# Patient Record
Sex: Male | Born: 1935 | Race: White | Hispanic: No | Smoking: Former smoker
Health system: Southern US, Community
[De-identification: ages and names within clinical notes are randomized; demographics above are authoritative.]

## PROBLEM LIST (undated history)

## (undated) DIAGNOSIS — I2510 Atherosclerotic heart disease of native coronary artery without angina pectoris: Secondary | ICD-10-CM

## (undated) DIAGNOSIS — J449 Chronic obstructive pulmonary disease, unspecified: Secondary | ICD-10-CM

## (undated) DIAGNOSIS — C4491 Basal cell carcinoma of skin, unspecified: Secondary | ICD-10-CM

## (undated) DIAGNOSIS — I4891 Unspecified atrial fibrillation: Secondary | ICD-10-CM

## (undated) DIAGNOSIS — I10 Essential (primary) hypertension: Secondary | ICD-10-CM

## (undated) DIAGNOSIS — E785 Hyperlipidemia, unspecified: Secondary | ICD-10-CM

## (undated) HISTORY — DX: Hyperlipidemia, unspecified: E78.5

## (undated) HISTORY — PX: HERNIA REPAIR: SHX51

---
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
## 1997-02-26 HISTORY — PX: CORONARY ARTERY BYPASS GRAFT: SHX141

## 2008-02-05 ENCOUNTER — Ambulatory Visit: Payer: Self-pay | Admitting: Ophthalmology

## 2008-02-13 ENCOUNTER — Inpatient Hospital Stay: Payer: Self-pay | Admitting: Internal Medicine

## 2008-03-23 ENCOUNTER — Ambulatory Visit: Payer: Self-pay | Admitting: Ophthalmology

## 2012-10-02 ENCOUNTER — Emergency Department: Payer: Self-pay | Admitting: Emergency Medicine

## 2012-10-10 ENCOUNTER — Ambulatory Visit: Payer: Self-pay | Admitting: Orthopedic Surgery

## 2012-10-15 ENCOUNTER — Ambulatory Visit: Payer: Self-pay | Admitting: Orthopedic Surgery

## 2012-10-15 LAB — BASIC METABOLIC PANEL WITH GFR
Anion Gap: 0 — ABNORMAL LOW
BUN: 30 mg/dL — ABNORMAL HIGH
Calcium, Total: 9.3 mg/dL
Chloride: 104 mmol/L
Co2: 34 mmol/L — ABNORMAL HIGH
Creatinine: 1.29 mg/dL
EGFR (African American): 57 — ABNORMAL LOW
EGFR (Non-African Amer.): 49 — ABNORMAL LOW
Glucose: 101 mg/dL — ABNORMAL HIGH
Osmolality: 282
Potassium: 4.4 mmol/L
Sodium: 138 mmol/L

## 2012-10-15 LAB — HEMOGLOBIN: HGB: 14.2 g/dL

## 2012-10-21 ENCOUNTER — Ambulatory Visit: Payer: Self-pay | Admitting: Orthopedic Surgery

## 2013-06-08 DIAGNOSIS — R0602 Shortness of breath: Secondary | ICD-10-CM | POA: Diagnosis not present

## 2013-06-08 DIAGNOSIS — I059 Rheumatic mitral valve disease, unspecified: Secondary | ICD-10-CM | POA: Diagnosis not present

## 2013-06-08 DIAGNOSIS — E782 Mixed hyperlipidemia: Secondary | ICD-10-CM | POA: Diagnosis not present

## 2013-06-08 DIAGNOSIS — I2510 Atherosclerotic heart disease of native coronary artery without angina pectoris: Secondary | ICD-10-CM | POA: Diagnosis not present

## 2013-06-25 DIAGNOSIS — R0602 Shortness of breath: Secondary | ICD-10-CM | POA: Diagnosis not present

## 2013-06-25 DIAGNOSIS — I119 Hypertensive heart disease without heart failure: Secondary | ICD-10-CM | POA: Diagnosis not present

## 2013-06-25 DIAGNOSIS — I059 Rheumatic mitral valve disease, unspecified: Secondary | ICD-10-CM | POA: Diagnosis not present

## 2013-06-25 DIAGNOSIS — E782 Mixed hyperlipidemia: Secondary | ICD-10-CM | POA: Diagnosis not present

## 2013-08-09 ENCOUNTER — Emergency Department: Payer: Self-pay | Admitting: Emergency Medicine

## 2013-08-09 DIAGNOSIS — I714 Abdominal aortic aneurysm, without rupture, unspecified: Secondary | ICD-10-CM | POA: Diagnosis not present

## 2013-08-09 DIAGNOSIS — N201 Calculus of ureter: Secondary | ICD-10-CM | POA: Diagnosis not present

## 2013-08-09 DIAGNOSIS — I713 Abdominal aortic aneurysm, ruptured, unspecified: Secondary | ICD-10-CM | POA: Diagnosis not present

## 2013-08-09 DIAGNOSIS — N210 Calculus in bladder: Secondary | ICD-10-CM | POA: Diagnosis not present

## 2013-08-09 DIAGNOSIS — J449 Chronic obstructive pulmonary disease, unspecified: Secondary | ICD-10-CM | POA: Diagnosis not present

## 2013-08-09 DIAGNOSIS — E785 Hyperlipidemia, unspecified: Secondary | ICD-10-CM | POA: Diagnosis not present

## 2013-08-09 DIAGNOSIS — K8020 Calculus of gallbladder without cholecystitis without obstruction: Secondary | ICD-10-CM | POA: Diagnosis not present

## 2013-08-09 DIAGNOSIS — N200 Calculus of kidney: Secondary | ICD-10-CM | POA: Diagnosis not present

## 2013-08-09 LAB — COMPREHENSIVE METABOLIC PANEL
ALT: 18 U/L (ref 12–78)
ANION GAP: 2 — AB (ref 7–16)
AST: 29 U/L (ref 15–37)
Albumin: 4.1 g/dL (ref 3.4–5.0)
Alkaline Phosphatase: 76 U/L
BUN: 21 mg/dL — ABNORMAL HIGH (ref 7–18)
Bilirubin,Total: 1.2 mg/dL — ABNORMAL HIGH (ref 0.2–1.0)
CALCIUM: 9.1 mg/dL (ref 8.5–10.1)
Chloride: 103 mmol/L (ref 98–107)
Co2: 32 mmol/L (ref 21–32)
Creatinine: 1.33 mg/dL — ABNORMAL HIGH (ref 0.60–1.30)
EGFR (African American): 54 — ABNORMAL LOW
EGFR (Non-African Amer.): 47 — ABNORMAL LOW
Glucose: 113 mg/dL — ABNORMAL HIGH (ref 65–99)
Osmolality: 278 (ref 275–301)
Potassium: 4.5 mmol/L (ref 3.5–5.1)
SODIUM: 137 mmol/L (ref 136–145)
Total Protein: 7.5 g/dL (ref 6.4–8.2)

## 2013-08-09 LAB — URINALYSIS, COMPLETE
BACTERIA: NONE SEEN
Bilirubin,UR: NEGATIVE
GLUCOSE, UR: NEGATIVE mg/dL (ref 0–75)
Ketone: NEGATIVE
Nitrite: NEGATIVE
PROTEIN: NEGATIVE
Ph: 5 (ref 4.5–8.0)
RBC,UR: 252 /HPF (ref 0–5)
SPECIFIC GRAVITY: 1.023 (ref 1.003–1.030)

## 2013-08-09 LAB — CBC
HCT: 45.3 % (ref 40.0–52.0)
HGB: 15.1 g/dL (ref 13.0–18.0)
MCH: 31.2 pg (ref 26.0–34.0)
MCHC: 33.3 g/dL (ref 32.0–36.0)
MCV: 94 fL (ref 80–100)
Platelet: 232 10*3/uL (ref 150–440)
RBC: 4.84 10*6/uL (ref 4.40–5.90)
RDW: 13.1 % (ref 11.5–14.5)
WBC: 7.6 10*3/uL (ref 3.8–10.6)

## 2013-11-10 DIAGNOSIS — Z23 Encounter for immunization: Secondary | ICD-10-CM | POA: Diagnosis not present

## 2014-02-26 HISTORY — PX: SKIN CANCER EXCISION: SHX779

## 2014-06-18 NOTE — Op Note (Signed)
PATIENT NAME:  ADDEN, STROUT MR#:  [id number] DATE OF BIRTH:  1921/07/06  DATE OF PROCEDURE:  10/21/2012  PREOPERATIVE DIAGNOSIS: Compression fracture, L1, L3, L4.   POSTOPERATIVE DIAGNOSIS: Compression fracture, L1, L3, L4.   PROCEDURE: Biopsy and kyphoplasty at L1, L3 and L4.   ANESTHESIA: MAC.   SURGEON: Laurene Footman, M.D.   DESCRIPTION OF PROCEDURE: The patient was brought to the operating room and after adequate sedation was obtained, he was placed in a prone position. The C-arm was brought in to obtain adequate visualization of all levels. After prepping the skin with alcohol and a timeout procedure completed, local anesthetic was infiltrated on both sides at each level and then the back was then prepped and draped in the usual sterile fashion. A repeat timeout procedure completed.   Spinal needle was then used to infiltrate a mixture of 0.5% Sensorcaine and Xylocaine in the  tract down to the pedicle, giving local anesthetic at the level of the bone.   L1 was addressed first with access on both sides. Stab incision was made and the trocar advanced into the vertebral body, making certain not to get into the spinal canal. After the right sided cannula was placed, a biopsy was obtained then drilling with the small hand drill from the Slovan set. Left side was approached in a similar fashion. Both sides were then inflated to approximately 4 mL on each side. The vertebral body was then filled using the Kyphon bone cement with 8 mL infiltrating, giving partial correction of the L1 deformity and good interdigitation.   Stab incision was made on the right side at L3. Trocar advanced in a similar fashion. Biopsy obtained. It was in the center position of the body, and with inflation, there appeared to be appropriate location of the balloon. There was scoliosis at the L4 level. The left side was utilized with an introduction of the cannula, inflation of the balloon, and then placing cement in both  of these levels with approximately 4.5 to 5 mL at each level. There did not appear to be any extravasation of cement. There appeared to be good fill. All trocars were removed and permanent C-arm views were obtained. The wounds were closed with Dermabond and Band-Aids applied. The patient was sent to recovery in stable condition.   ESTIMATED BLOOD LOSS: Minimal.   COMPLICATIONS: None.   SPECIMEN: Vertebral body biopsy L1, L3, and L4.    ____________________________ Laurene Footman, MD [id number] D: 10/21/2012 21:05:52 ET T: 10/21/2012 21:50:53 ET JOB#: [id number]  cc: Laurene Footman, MD, <Dictator> Laurene Footman MD ELECTRONICALLY SIGNED 10/22/2012 11:56

## 2014-07-28 DIAGNOSIS — C44311 Basal cell carcinoma of skin of nose: Secondary | ICD-10-CM | POA: Diagnosis not present

## 2014-07-28 DIAGNOSIS — D485 Neoplasm of uncertain behavior of skin: Secondary | ICD-10-CM | POA: Diagnosis not present

## 2014-10-19 DIAGNOSIS — C44311 Basal cell carcinoma of skin of nose: Secondary | ICD-10-CM | POA: Diagnosis not present

## 2014-11-08 DIAGNOSIS — Z23 Encounter for immunization: Secondary | ICD-10-CM | POA: Diagnosis not present

## 2015-06-16 DIAGNOSIS — C44311 Basal cell carcinoma of skin of nose: Secondary | ICD-10-CM | POA: Diagnosis not present

## 2015-08-01 ENCOUNTER — Encounter: Payer: Self-pay | Admitting: Unknown Physician Specialty

## 2015-08-01 ENCOUNTER — Ambulatory Visit (INDEPENDENT_AMBULATORY_CARE_PROVIDER_SITE_OTHER): Payer: Medicare Other | Admitting: Unknown Physician Specialty

## 2015-08-01 VITALS — BP 130/84 | HR 86 | Temp 97.9°F | Ht 66.7 in | Wt 153.0 lb

## 2015-08-01 DIAGNOSIS — J42 Unspecified chronic bronchitis: Secondary | ICD-10-CM

## 2015-08-01 DIAGNOSIS — I25810 Atherosclerosis of coronary artery bypass graft(s) without angina pectoris: Secondary | ICD-10-CM | POA: Diagnosis not present

## 2015-08-01 DIAGNOSIS — I499 Cardiac arrhythmia, unspecified: Secondary | ICD-10-CM

## 2015-08-01 DIAGNOSIS — Z66 Do not resuscitate: Secondary | ICD-10-CM | POA: Insufficient documentation

## 2015-08-01 DIAGNOSIS — I4891 Unspecified atrial fibrillation: Secondary | ICD-10-CM | POA: Diagnosis not present

## 2015-08-01 DIAGNOSIS — N400 Enlarged prostate without lower urinary tract symptoms: Secondary | ICD-10-CM

## 2015-08-01 DIAGNOSIS — I2510 Atherosclerotic heart disease of native coronary artery without angina pectoris: Secondary | ICD-10-CM | POA: Insufficient documentation

## 2015-08-01 DIAGNOSIS — J449 Chronic obstructive pulmonary disease, unspecified: Secondary | ICD-10-CM | POA: Insufficient documentation

## 2015-08-01 NOTE — Assessment & Plan Note (Signed)
EKG show intermittent a-fib.  Pt states he is not interested in any treatment.

## 2015-08-01 NOTE — Progress Notes (Signed)
BP 130/84 mmHg  Pulse 86  Temp(Src) 97.9 F (36.6 C)  Ht 5' 6.7" (1.694 m)  Wt 153 lb (69.4 kg)  BMI 24.18 kg/m2  SpO2 94%   Subjective:    Patient ID: Billy Choi, male    DOB: June 14, 1921, 82 y.o.   MRN: [id number]  HPI: Billy Choi is a 82 y.o. male  Chief Complaint  Patient presents with  . Establish Care   This is a new patient who has been going to the [place].    Family History  Problem Relation Age of Onset  . Arthritis Father    Social History   Social History  . Marital Status: Unknown    Spouse Name: N/A  . Number of Children: N/A  . Years of Education: N/A   Occupational History  . Not on file.   Social History Main Topics  . Smoking status: Former Research scientist (life sciences)  . Smokeless tobacco: Current User  . Alcohol Use: Yes     Comment: wine on occasion  . Drug Use: No  . Sexual Activity: No   Other Topics Concern  . Not on file   Social History Narrative  . No narrative on file   Past Medical History  Diagnosis Date  . Hyperlipidemia    Past Surgical History  Procedure Laterality Date  . Cardiac surgery  1999  . Hernia repair      x2  . Skin cancer excision  2016    COPD States he smoked a "long time ago" and quit in the mid 50's.  He has had no hospitalizations for his breathing.  States it is worse around bedtime.  Has Symbicort but rarely takes as he is "afraid of it."  Takes Combivent once a day usually before bed  CAD Sounds like he had a CABG in 1999 "took my heart out and put it on the table."   He does not see a cardiologist.    BPH Takes a medication for his prostate but states he has no problems urinating and didn't know he takes anything for this.     Relevant past medical, surgical, family and social history reviewed and updated as indicated. Interim medical history since our last visit reviewed. Allergies and medications reviewed and updated.  Review of Systems  Constitutional: Negative.   HENT: Negative.   Eyes: Negative.    Respiratory:       Thinks he might have broken a rib while changing the oil in his car  Cardiovascular: Negative.   Gastrointestinal: Negative.        Recently episode of what he feels was food poisoning that is now resolved  Endocrine: Negative.   Hematological: Negative.   Psychiatric/Behavioral: Negative.     Per HPI unless specifically indicated above     Objective:    BP 130/84 mmHg  Pulse 86  Temp(Src) 97.9 F (36.6 C)  Ht 5' 6.7" (1.694 m)  Wt 153 lb (69.4 kg)  BMI 24.18 kg/m2  SpO2 94%  Wt Readings from Last 3 Encounters:  08/01/15 153 lb (69.4 kg)    Physical Exam  Constitutional: He is oriented to person, place, and time. He appears well-developed and well-nourished. No distress.  HENT:  Head: Normocephalic and atraumatic.  Eyes: Conjunctivae and lids are normal. Right eye exhibits no discharge. Left eye exhibits no discharge. No scleral icterus.  Neck: Normal range of motion. Neck supple. No JVD present. Carotid bruit is not present.  Cardiovascular: Normal rate, regular rhythm and normal  heart sounds.   Pulmonary/Chest: Effort normal and breath sounds normal. No respiratory distress.  Abdominal: Normal appearance. There is no splenomegaly or hepatomegaly.  Musculoskeletal: Normal range of motion.  Neurological: He is alert and oriented to person, place, and time.  Skin: Skin is warm, dry and intact. No rash noted. No pallor.  Psychiatric: He has a normal mood and affect. His behavior is normal. Judgment and thought content normal.    No results found for this or any previous visit.    Assessment & Plan:   Problem List Items Addressed This Visit      Unprioritized   Atrial fibrillation ([place])    Refusing any evaluation       Relevant Medications   simvastatin (ZOCOR) 80 MG tablet   aspirin 325 MG tablet   BPH (benign prostatic hyperplasia)    Stable, continue present medications.        CAD (coronary artery disease) - Primary    Pt states he  feels fine and doesn't want any f/u with stress testing or cardiology.  Continue Atorvastatin as he has been taking it for some time      Relevant Medications   simvastatin (ZOCOR) 80 MG tablet   aspirin 325 MG tablet   COPD (chronic obstructive pulmonary disease) (HCC)    Continue Comivent prn      Relevant Medications   budesonide-formoterol (SYMBICORT) 160-4.5 MCG/ACT inhaler   Ipratropium-Albuterol (COMBIVENT) 20-100 MCG/ACT AERS respimat   fluticasone (FLONASE) 50 MCG/ACT nasal spray   DNR (do not resuscitate)    Pt told he can revoke this at any time      Relevant Orders   DNR (Do Not Resuscitate)   Irregular heart beat    EKG show intermittent a-fib.  Pt states he is not interested in any treatment.        Relevant Orders   EKG 12-Lead (Completed)       Follow up plan: Return in about 6 months (around 01/31/2016) for for physical.

## 2015-08-01 NOTE — Assessment & Plan Note (Signed)
Continue Comivent prn

## 2015-08-01 NOTE — Assessment & Plan Note (Signed)
Stable, continue present medications.   

## 2015-08-01 NOTE — Assessment & Plan Note (Addendum)
Pt states he feels fine and doesn't want any f/u with stress testing or cardiology.  Continue Atorvastatin as he has been taking it for some time

## 2015-08-01 NOTE — Assessment & Plan Note (Signed)
Refusing any evaluation

## 2015-08-01 NOTE — Assessment & Plan Note (Addendum)
Pt said he does not want any chest compressions.  Pt told he can revoke this at any time.

## 2015-11-09 DIAGNOSIS — Z23 Encounter for immunization: Secondary | ICD-10-CM | POA: Diagnosis not present

## 2016-01-18 ENCOUNTER — Encounter: Payer: Self-pay | Admitting: Unknown Physician Specialty

## 2016-01-31 ENCOUNTER — Encounter: Payer: Medicare Other | Admitting: Unknown Physician Specialty

## 2016-02-08 ENCOUNTER — Encounter: Payer: Medicare Other | Admitting: Unknown Physician Specialty

## 2016-02-09 ENCOUNTER — Encounter: Payer: Self-pay | Admitting: Unknown Physician Specialty

## 2016-02-09 ENCOUNTER — Ambulatory Visit (INDEPENDENT_AMBULATORY_CARE_PROVIDER_SITE_OTHER): Payer: Medicare Other | Admitting: Unknown Physician Specialty

## 2016-02-09 VITALS — BP 173/102 | HR 80 | Temp 97.9°F | Ht 67.0 in | Wt 157.6 lb

## 2016-02-09 DIAGNOSIS — Z23 Encounter for immunization: Secondary | ICD-10-CM | POA: Diagnosis not present

## 2016-02-09 DIAGNOSIS — Z0000 Encounter for general adult medical examination without abnormal findings: Secondary | ICD-10-CM

## 2016-02-09 DIAGNOSIS — I10 Essential (primary) hypertension: Secondary | ICD-10-CM

## 2016-02-09 DIAGNOSIS — J42 Unspecified chronic bronchitis: Secondary | ICD-10-CM | POA: Diagnosis not present

## 2016-02-09 DIAGNOSIS — Z0001 Encounter for general adult medical examination with abnormal findings: Secondary | ICD-10-CM

## 2016-02-09 DIAGNOSIS — I25810 Atherosclerosis of coronary artery bypass graft(s) without angina pectoris: Secondary | ICD-10-CM | POA: Diagnosis not present

## 2016-02-09 DIAGNOSIS — I4891 Unspecified atrial fibrillation: Secondary | ICD-10-CM | POA: Diagnosis not present

## 2016-02-09 DIAGNOSIS — E7800 Pure hypercholesterolemia, unspecified: Secondary | ICD-10-CM

## 2016-02-09 MED ORDER — AMLODIPINE BESYLATE 5 MG PO TABS: 5.0000 mg | ORAL_TABLET | Freq: Every day | ORAL | 1 refills | Status: DC

## 2016-02-09 NOTE — Progress Notes (Signed)
BP (!) 173/102 (BP Location: Left Arm, Cuff Size: Normal)   Pulse 80   Temp 97.9 F (36.6 C)   Ht 5\' 7"  (1.702 m)   Wt 157 lb 9.6 oz (71.5 kg)   SpO2 95%   BMI 24.68 kg/m    Subjective:    Patient ID: Billy Choi, male    DOB: 07-08-1921, 82 y.o.   MRN: [id number]  HPI: Billy Choi is a 82 y.o. male  Chief Complaint  Patient presents with  . Medicare Wellness   Social History   Social History  . Marital status: Unknown    Spouse name: N/A  . Number of children: N/A  . Years of education: N/A   Occupational History  . Not on file.   Social History Main Topics  . Smoking status: Former Research scientist (life sciences)  . Smokeless tobacco: Current User    Types: Chew  . Alcohol use Yes     Comment: wine on occasion  . Drug use: No  . Sexual activity: No   Other Topics Concern  . Not on file   Social History Narrative  . No narrative on file   Family History  Problem Relation Age of Onset  . Arthritis Father    Past Medical History:  Diagnosis Date  . Hyperlipidemia    Past Surgical History:  Procedure Laterality Date  . CARDIAC SURGERY  1999  . HERNIA REPAIR     x2  . SKIN CANCER EXCISION  2016   Functional Status Survey: Is the patient deaf or have difficulty hearing?: No Does the patient have difficulty seeing, even when wearing glasses/contacts?: No Does the patient have difficulty concentrating, remembering, or making decisions?: Yes Does the patient have difficulty walking or climbing stairs?: No (" I don't do this") Does the patient have difficulty dressing or bathing?: No Does the patient have difficulty doing errands alone such as visiting a doctor's office or shopping?: No  Daughter keeps an eye on him  Fall Risk  02/09/2016 08/01/2015  Falls in the past year? No No   Depression screen [facility] 2/9 02/09/2016 08/01/2015  Decreased Interest 0 0  Down, Depressed, Hopeless 0 0  PHQ - 2 Score 0 0   Mini-cog Has some aphasia Good clock and able to recall 3 words.      Hypertension Taking no BP meds Average home BPs Not checking   No problems or lightheadedness No chest pain with exertion or shortness of breath No Edema  Hyperlipidemia Using medications without problems: No Muscle aches  Diet compliance: Exercise:  COPD "Some days it's good and some days it's bad" Taking his inhalers about once a day before bed    Relevant past medical, surgical, family and social history reviewed and updated as indicated. Interim medical history since our last visit reviewed. Allergies and medications reviewed and updated.  Review of Systems  Per HPI unless specifically indicated above     Objective:    BP (!) 173/102 (BP Location: Left Arm, Cuff Size: Normal)   Pulse 80   Temp 97.9 F (36.6 C)   Ht 5\' 7"  (1.702 m)   Wt 157 lb 9.6 oz (71.5 kg)   SpO2 95%   BMI 24.68 kg/m   Wt Readings from Last 3 Encounters:  02/09/16 157 lb 9.6 oz (71.5 kg)  08/01/15 153 lb (69.4 kg)    Physical Exam  Constitutional: He is oriented to person, place, and time. He appears well-developed and well-nourished. No distress.  HENT:  Head: Normocephalic and atraumatic.  Eyes: Conjunctivae and lids are normal. Right eye exhibits no discharge. Left eye exhibits no discharge. No scleral icterus.  Neck: Normal range of motion. Neck supple. No JVD present. Carotid bruit is not present.  Cardiovascular: Normal heart sounds.  An irregularly irregular rhythm present.  Pulmonary/Chest: Effort normal and breath sounds normal. No respiratory distress.  Abdominal: Normal appearance. There is no splenomegaly or hepatomegaly.  Musculoskeletal: Normal range of motion.  Neurological: He is alert and oriented to person, place, and time.  Skin: Skin is warm, dry and intact. No rash noted. No pallor.  Psychiatric: He has a normal mood and affect. His behavior is normal. Judgment and thought content normal.      Assessment & Plan:   Problem List Items Addressed This Visit        Unprioritized   Atrial fibrillation (HCC)   Relevant Medications   amLODipine (NORVASC) 5 MG tablet   CAD (coronary artery disease)   Relevant Medications   amLODipine (NORVASC) 5 MG tablet   Other Relevant Orders   Comprehensive metabolic panel   COPD (chronic obstructive pulmonary disease) (HCC)    Stable, continue present medications.        Hypercholesterolemia    Check today.  Continue present      Relevant Medications   amLODipine (NORVASC) 5 MG tablet   Other Relevant Orders   Comprehensive metabolic panel   Lipid Panel w/o Chol/HDL Ratio   Hypertension    Start Amlodipine 5 mg daily       Relevant Medications   amLODipine (NORVASC) 5 MG tablet   Other Relevant Orders   Comprehensive metabolic panel    Other Visit Diagnoses    Need for vaccine for TD (tetanus-diphtheria)    -  Primary       Follow up plan: Return in about 4 weeks (around 03/08/2016).

## 2016-02-09 NOTE — Assessment & Plan Note (Signed)
Stable, continue present medications.   

## 2016-02-09 NOTE — Assessment & Plan Note (Signed)
Start Amlodipine 5mg daily

## 2016-02-09 NOTE — Assessment & Plan Note (Signed)
Check today.  Continue present

## 2016-02-10 ENCOUNTER — Encounter: Payer: Self-pay | Admitting: Unknown Physician Specialty

## 2016-02-10 LAB — COMPREHENSIVE METABOLIC PANEL
A/G RATIO: 1.6 (ref 1.2–2.2)
ALK PHOS: 69 IU/L (ref 39–117)
ALT: 8 IU/L (ref 0–44)
AST: 14 IU/L (ref 0–40)
Albumin: 3.9 g/dL (ref 3.2–4.6)
BUN/Creatinine Ratio: 19 (ref 10–24)
BUN: 17 mg/dL (ref 10–36)
Bilirubin Total: 0.4 mg/dL (ref 0.0–1.2)
CALCIUM: 8.9 mg/dL (ref 8.6–10.2)
CHLORIDE: 104 mmol/L (ref 96–106)
CO2: 29 mmol/L (ref 18–29)
Creatinine, Ser: 0.89 mg/dL (ref 0.76–1.27)
GFR calc Af Amer: 86 mL/min/{1.73_m2} (ref >59)
GFR, EST NON AFRICAN AMERICAN: 74 mL/min/{1.73_m2} (ref >59)
Globulin, Total: 2.4 g/dL (ref 1.5–4.5)
Glucose: 103 mg/dL — ABNORMAL HIGH (ref 65–99)
POTASSIUM: 3.9 mmol/L (ref 3.5–5.2)
Sodium: 146 mmol/L — ABNORMAL HIGH (ref 134–144)
Total Protein: 6.3 g/dL (ref 6.0–8.5)

## 2016-02-10 LAB — LIPID PANEL W/O CHOL/HDL RATIO
CHOLESTEROL TOTAL: 141 mg/dL (ref 100–199)
HDL: 51 mg/dL (ref >39)
LDL Calculated: 69 mg/dL (ref 0–99)
TRIGLYCERIDES: 107 mg/dL (ref 0–149)
VLDL Cholesterol Cal: 21 mg/dL (ref 5–40)

## 2016-02-10 NOTE — Progress Notes (Signed)
   There were no vitals taken for this visit.   Subjective:    Patient ID: Billy Choi, male    DOB: 1921/09/27, 82 y.o.   MRN: [id number]  HPI: Billy Choi is a 82 y.o. male  No chief complaint on file.   Relevant past medical, surgical, family and social history reviewed and updated as indicated. Interim medical history since our last visit reviewed. Allergies and medications reviewed and updated.  Review of Systems  Per HPI unless specifically indicated above     Objective:    There were no vitals taken for this visit.  Wt Readings from Last 3 Encounters:  02/09/16 157 lb 9.6 oz (71.5 kg)  08/01/15 153 lb (69.4 kg)    Physical Exam  Results for orders placed or performed in visit on 02/09/16  Comprehensive metabolic panel  Result Value Ref Range   Glucose 103 (H) 65 - 99 mg/dL   BUN 17 10 - 36 mg/dL   Creatinine, Ser 0.89 0.76 - 1.27 mg/dL   GFR calc non Af Amer 74 >59 mL/min/1.73   GFR calc Af Amer 86 >59 mL/min/1.73   BUN/Creatinine Ratio 19 10 - 24   Sodium 146 (H) 134 - 144 mmol/L   Potassium 3.9 3.5 - 5.2 mmol/L   Chloride 104 96 - 106 mmol/L   CO2 29 18 - 29 mmol/L   Calcium 8.9 8.6 - 10.2 mg/dL   Total Protein 6.3 6.0 - 8.5 g/dL   Albumin 3.9 3.2 - 4.6 g/dL   Globulin, Total 2.4 1.5 - 4.5 g/dL   Albumin/Globulin Ratio 1.6 1.2 - 2.2   Bilirubin Total 0.4 0.0 - 1.2 mg/dL   Alkaline Phosphatase 69 39 - 117 IU/L   AST 14 0 - 40 IU/L   ALT 8 0 - 44 IU/L  Lipid Panel w/o Chol/HDL Ratio  Result Value Ref Range   Cholesterol, Total 141 100 - 199 mg/dL   Triglycerides 107 0 - 149 mg/dL   HDL 51 >39 mg/dL   VLDL Cholesterol Cal 21 5 - 40 mg/dL   LDL Calculated 69 0 - 99 mg/dL      Assessment & Plan:   Problem List Items Addressed This Visit    None       Follow up plan: No Follow-up on file.

## 2016-02-17 ENCOUNTER — Encounter: Payer: Medicare Other | Admitting: Unknown Physician Specialty

## 2016-03-09 ENCOUNTER — Ambulatory Visit: Payer: Medicare Other | Admitting: Unknown Physician Specialty

## 2016-03-14 ENCOUNTER — Ambulatory Visit: Payer: Self-pay | Admitting: Unknown Physician Specialty

## 2016-03-15 ENCOUNTER — Ambulatory Visit: Payer: Medicare Other | Admitting: Unknown Physician Specialty

## 2016-05-09 DIAGNOSIS — E538 Deficiency of other specified B group vitamins: Secondary | ICD-10-CM | POA: Diagnosis not present

## 2016-05-09 DIAGNOSIS — Z0000 Encounter for general adult medical examination without abnormal findings: Secondary | ICD-10-CM | POA: Diagnosis not present

## 2016-05-09 DIAGNOSIS — J449 Chronic obstructive pulmonary disease, unspecified: Secondary | ICD-10-CM | POA: Diagnosis not present

## 2016-05-09 DIAGNOSIS — I10 Essential (primary) hypertension: Secondary | ICD-10-CM | POA: Diagnosis not present

## 2016-05-09 DIAGNOSIS — I25810 Atherosclerosis of coronary artery bypass graft(s) without angina pectoris: Secondary | ICD-10-CM | POA: Diagnosis not present

## 2016-05-09 DIAGNOSIS — R739 Hyperglycemia, unspecified: Secondary | ICD-10-CM | POA: Diagnosis not present

## 2016-05-11 DIAGNOSIS — E538 Deficiency of other specified B group vitamins: Secondary | ICD-10-CM | POA: Diagnosis not present

## 2016-06-11 DIAGNOSIS — E538 Deficiency of other specified B group vitamins: Secondary | ICD-10-CM | POA: Diagnosis not present

## 2016-07-12 DIAGNOSIS — E538 Deficiency of other specified B group vitamins: Secondary | ICD-10-CM | POA: Diagnosis not present

## 2016-08-14 DIAGNOSIS — E538 Deficiency of other specified B group vitamins: Secondary | ICD-10-CM | POA: Diagnosis not present

## 2016-08-20 DIAGNOSIS — E538 Deficiency of other specified B group vitamins: Secondary | ICD-10-CM | POA: Diagnosis not present

## 2016-11-13 DIAGNOSIS — E7800 Pure hypercholesterolemia, unspecified: Secondary | ICD-10-CM | POA: Diagnosis not present

## 2016-11-13 DIAGNOSIS — E538 Deficiency of other specified B group vitamins: Secondary | ICD-10-CM | POA: Diagnosis not present

## 2016-11-13 DIAGNOSIS — I25810 Atherosclerosis of coronary artery bypass graft(s) without angina pectoris: Secondary | ICD-10-CM | POA: Diagnosis not present

## 2016-11-13 DIAGNOSIS — J449 Chronic obstructive pulmonary disease, unspecified: Secondary | ICD-10-CM | POA: Diagnosis not present

## 2016-11-13 DIAGNOSIS — R739 Hyperglycemia, unspecified: Secondary | ICD-10-CM | POA: Diagnosis not present

## 2016-11-13 DIAGNOSIS — I10 Essential (primary) hypertension: Secondary | ICD-10-CM | POA: Diagnosis not present

## 2016-11-19 ENCOUNTER — Emergency Department: Payer: PPO

## 2016-11-19 ENCOUNTER — Inpatient Hospital Stay
Admission: EM | Admit: 2016-11-19 | Discharge: 2016-11-22 | DRG: 469 | Disposition: A | Discharge status: Skilled Nursing Facility | Payer: PPO | Attending: Internal Medicine | Admitting: Internal Medicine

## 2016-11-19 DIAGNOSIS — I10 Essential (primary) hypertension: Secondary | ICD-10-CM | POA: Diagnosis present

## 2016-11-19 DIAGNOSIS — J9601 Acute respiratory failure with hypoxia: Secondary | ICD-10-CM | POA: Diagnosis not present

## 2016-11-19 DIAGNOSIS — E785 Hyperlipidemia, unspecified: Secondary | ICD-10-CM | POA: Diagnosis not present

## 2016-11-19 DIAGNOSIS — I2510 Atherosclerotic heart disease of native coronary artery without angina pectoris: Secondary | ICD-10-CM | POA: Diagnosis present

## 2016-11-19 DIAGNOSIS — S72009A Fracture of unspecified part of neck of unspecified femur, initial encounter for closed fracture: Secondary | ICD-10-CM | POA: Diagnosis not present

## 2016-11-19 DIAGNOSIS — J189 Pneumonia, unspecified organism: Secondary | ICD-10-CM | POA: Diagnosis present

## 2016-11-19 DIAGNOSIS — R1312 Dysphagia, oropharyngeal phase: Secondary | ICD-10-CM | POA: Diagnosis not present

## 2016-11-19 DIAGNOSIS — J441 Chronic obstructive pulmonary disease with (acute) exacerbation: Secondary | ICD-10-CM | POA: Diagnosis present

## 2016-11-19 DIAGNOSIS — Z79899 Other long term (current) drug therapy: Secondary | ICD-10-CM

## 2016-11-19 DIAGNOSIS — Z96642 Presence of left artificial hip joint: Secondary | ICD-10-CM | POA: Diagnosis not present

## 2016-11-19 DIAGNOSIS — W19XXXA Unspecified fall, initial encounter: Secondary | ICD-10-CM | POA: Diagnosis present

## 2016-11-19 DIAGNOSIS — J449 Chronic obstructive pulmonary disease, unspecified: Secondary | ICD-10-CM | POA: Diagnosis not present

## 2016-11-19 DIAGNOSIS — F17220 Nicotine dependence, chewing tobacco, uncomplicated: Secondary | ICD-10-CM | POA: Diagnosis present

## 2016-11-19 DIAGNOSIS — R55 Syncope and collapse: Secondary | ICD-10-CM

## 2016-11-19 DIAGNOSIS — S0990XA Unspecified injury of head, initial encounter: Secondary | ICD-10-CM | POA: Diagnosis not present

## 2016-11-19 DIAGNOSIS — J440 Chronic obstructive pulmonary disease with acute lower respiratory infection: Secondary | ICD-10-CM | POA: Diagnosis present

## 2016-11-19 DIAGNOSIS — Z66 Do not resuscitate: Secondary | ICD-10-CM | POA: Diagnosis present

## 2016-11-19 DIAGNOSIS — I482 Chronic atrial fibrillation: Secondary | ICD-10-CM | POA: Diagnosis not present

## 2016-11-19 DIAGNOSIS — Z888 Allergy status to other drugs, medicaments and biological substances status: Secondary | ICD-10-CM | POA: Diagnosis not present

## 2016-11-19 DIAGNOSIS — G8918 Other acute postprocedural pain: Secondary | ICD-10-CM

## 2016-11-19 DIAGNOSIS — S72002A Fracture of unspecified part of neck of left femur, initial encounter for closed fracture: Secondary | ICD-10-CM | POA: Diagnosis not present

## 2016-11-19 DIAGNOSIS — Z471 Aftercare following joint replacement surgery: Secondary | ICD-10-CM | POA: Diagnosis not present

## 2016-11-19 DIAGNOSIS — M25552 Pain in left hip: Secondary | ICD-10-CM | POA: Diagnosis present

## 2016-11-19 DIAGNOSIS — S79912A Unspecified injury of left hip, initial encounter: Secondary | ICD-10-CM | POA: Diagnosis not present

## 2016-11-19 DIAGNOSIS — M25559 Pain in unspecified hip: Secondary | ICD-10-CM | POA: Diagnosis not present

## 2016-11-19 DIAGNOSIS — Z951 Presence of aortocoronary bypass graft: Secondary | ICD-10-CM

## 2016-11-19 DIAGNOSIS — D62 Acute posthemorrhagic anemia: Secondary | ICD-10-CM | POA: Diagnosis not present

## 2016-11-19 DIAGNOSIS — E43 Unspecified severe protein-calorie malnutrition: Secondary | ICD-10-CM | POA: Diagnosis present

## 2016-11-19 DIAGNOSIS — R41841 Cognitive communication deficit: Secondary | ICD-10-CM | POA: Diagnosis not present

## 2016-11-19 DIAGNOSIS — Y92009 Unspecified place in unspecified non-institutional (private) residence as the place of occurrence of the external cause: Secondary | ICD-10-CM

## 2016-11-19 DIAGNOSIS — Z8261 Family history of arthritis: Secondary | ICD-10-CM | POA: Diagnosis not present

## 2016-11-19 DIAGNOSIS — Z781 Physical restraint status: Secondary | ICD-10-CM | POA: Diagnosis not present

## 2016-11-19 DIAGNOSIS — J159 Unspecified bacterial pneumonia: Secondary | ICD-10-CM | POA: Diagnosis not present

## 2016-11-19 DIAGNOSIS — K219 Gastro-esophageal reflux disease without esophagitis: Secondary | ICD-10-CM | POA: Diagnosis not present

## 2016-11-19 DIAGNOSIS — Z85828 Personal history of other malignant neoplasm of skin: Secondary | ICD-10-CM | POA: Diagnosis not present

## 2016-11-19 DIAGNOSIS — Z01810 Encounter for preprocedural cardiovascular examination: Secondary | ICD-10-CM | POA: Diagnosis not present

## 2016-11-19 DIAGNOSIS — M542 Cervicalgia: Secondary | ICD-10-CM | POA: Diagnosis not present

## 2016-11-19 DIAGNOSIS — I4891 Unspecified atrial fibrillation: Secondary | ICD-10-CM | POA: Diagnosis not present

## 2016-11-19 DIAGNOSIS — Z419 Encounter for procedure for purposes other than remedying health state, unspecified: Secondary | ICD-10-CM

## 2016-11-19 DIAGNOSIS — I493 Ventricular premature depolarization: Secondary | ICD-10-CM | POA: Diagnosis not present

## 2016-11-19 DIAGNOSIS — Z7982 Long term (current) use of aspirin: Secondary | ICD-10-CM | POA: Diagnosis not present

## 2016-11-19 DIAGNOSIS — Z6821 Body mass index (BMI) 21.0-21.9, adult: Secondary | ICD-10-CM | POA: Diagnosis not present

## 2016-11-19 DIAGNOSIS — S72042A Displaced fracture of base of neck of left femur, initial encounter for closed fracture: Secondary | ICD-10-CM | POA: Diagnosis not present

## 2016-11-19 DIAGNOSIS — I4510 Unspecified right bundle-branch block: Secondary | ICD-10-CM | POA: Diagnosis not present

## 2016-11-19 DIAGNOSIS — M6281 Muscle weakness (generalized): Secondary | ICD-10-CM | POA: Diagnosis not present

## 2016-11-19 DIAGNOSIS — R918 Other nonspecific abnormal finding of lung field: Secondary | ICD-10-CM | POA: Diagnosis not present

## 2016-11-19 DIAGNOSIS — S72002D Fracture of unspecified part of neck of left femur, subsequent encounter for closed fracture with routine healing: Secondary | ICD-10-CM | POA: Diagnosis not present

## 2016-11-19 DIAGNOSIS — Z87891 Personal history of nicotine dependence: Secondary | ICD-10-CM | POA: Diagnosis not present

## 2016-11-19 HISTORY — DX: Chronic obstructive pulmonary disease, unspecified: J44.9

## 2016-11-19 HISTORY — DX: Unspecified atrial fibrillation: I48.91

## 2016-11-19 HISTORY — DX: Basal cell carcinoma of skin, unspecified: C44.91

## 2016-11-19 HISTORY — DX: Essential (primary) hypertension: I10

## 2016-11-19 HISTORY — DX: Atherosclerotic heart disease of native coronary artery without angina pectoris: I25.10

## 2016-11-19 LAB — COMPREHENSIVE METABOLIC PANEL
ALK PHOS: 55 U/L (ref 38–126)
ALT: 17 U/L (ref 17–63)
AST: 28 U/L (ref 15–41)
Albumin: 4.2 g/dL (ref 3.5–5.0)
Anion gap: 10 (ref 5–15)
BUN: 22 mg/dL — ABNORMAL HIGH (ref 6–20)
CALCIUM: 9.3 mg/dL (ref 8.9–10.3)
CHLORIDE: 99 mmol/L — AB (ref 101–111)
CO2: 29 mmol/L (ref 22–32)
CREATININE: 0.94 mg/dL (ref 0.61–1.24)
Glucose, Bld: 102 mg/dL — ABNORMAL HIGH (ref 65–99)
Potassium: 4.3 mmol/L (ref 3.5–5.1)
Sodium: 138 mmol/L (ref 135–145)
Total Bilirubin: 2.2 mg/dL — ABNORMAL HIGH (ref 0.3–1.2)
Total Protein: 7.1 g/dL (ref 6.5–8.1)

## 2016-11-19 LAB — TROPONIN I

## 2016-11-19 LAB — CBC WITH DIFFERENTIAL/PLATELET
Basophils Absolute: 0 10*3/uL (ref 0–0.1)
Basophils Relative: 0 %
EOS PCT: 0 %
Eosinophils Absolute: 0 10*3/uL (ref 0–0.7)
HCT: 44.9 % (ref 40.0–52.0)
Hemoglobin: 15.2 g/dL (ref 13.0–18.0)
LYMPHS ABS: 0.5 10*3/uL — AB (ref 1.0–3.6)
LYMPHS PCT: 4 %
MCH: 30.9 pg (ref 26.0–34.0)
MCHC: 34 g/dL (ref 32.0–36.0)
MCV: 90.9 fL (ref 80.0–100.0)
MONOS PCT: 6 %
Monocytes Absolute: 0.8 10*3/uL (ref 0.2–1.0)
Neutro Abs: 11.3 10*3/uL — ABNORMAL HIGH (ref 1.4–6.5)
Neutrophils Relative %: 90 %
PLATELETS: 204 10*3/uL (ref 150–440)
RBC: 4.93 MIL/uL (ref 4.40–5.90)
RDW: 13.5 % (ref 11.5–14.5)
WBC: 12.6 10*3/uL — AB (ref 3.8–10.6)

## 2016-11-19 MED ORDER — DEXTROSE 5 % IV SOLN
500.0000 mg | INTRAVENOUS | Status: DC
  Administered 2016-11-21: 500 mg via INTRAVENOUS
  Filled 2016-11-19 (×2): qty 500

## 2016-11-19 MED ORDER — ADULT MULTIVITAMIN W/MINERALS CH
1.0000 | ORAL_TABLET | Freq: Every day | ORAL | Status: DC
  Administered 2016-11-21 – 2016-11-22 (×2): 1 via ORAL
  Filled 2016-11-19 (×3): qty 1

## 2016-11-19 MED ORDER — METHYLPREDNISOLONE SODIUM SUCC 40 MG IJ SOLR
40.0000 mg | Freq: Three times a day (TID) | INTRAMUSCULAR | Status: DC
  Administered 2016-11-19 – 2016-11-21 (×5): 40 mg via INTRAVENOUS
  Filled 2016-11-19 (×5): qty 1

## 2016-11-19 MED ORDER — CEFTRIAXONE SODIUM IN DEXTROSE 20 MG/ML IV SOLN
1.0000 g | Freq: Once | INTRAVENOUS | Status: AC
  Administered 2016-11-19: 1 g via INTRAVENOUS
  Filled 2016-11-19: qty 50

## 2016-11-19 MED ORDER — HYDROCODONE-ACETAMINOPHEN 5-325 MG PO TABS
1.0000 | ORAL_TABLET | Freq: Four times a day (QID) | ORAL | Status: DC | PRN
  Administered 2016-11-21 (×2): 1 via ORAL
  Filled 2016-11-19 (×2): qty 1

## 2016-11-19 MED ORDER — POLYETHYLENE GLYCOL 3350 17 G PO PACK
17.0000 g | PACK | Freq: Every day | ORAL | Status: DC | PRN
  Administered 2016-11-21: 17 g via ORAL
  Filled 2016-11-19: qty 1

## 2016-11-19 MED ORDER — TRAMADOL HCL 50 MG PO TABS
50.0000 mg | ORAL_TABLET | Freq: Four times a day (QID) | ORAL | Status: DC | PRN
Start: 2016-11-19 — End: 2016-11-22

## 2016-11-19 MED ORDER — ATORVASTATIN CALCIUM 20 MG PO TABS
40.0000 mg | ORAL_TABLET | Freq: Every day | ORAL | Status: DC
  Filled 2016-11-19: qty 2

## 2016-11-19 MED ORDER — ACETAMINOPHEN 325 MG PO TABS: 650.0000 mg | ORAL_TABLET | Freq: Four times a day (QID) | ORAL | Status: DC | PRN

## 2016-11-19 MED ORDER — SODIUM CHLORIDE 0.9 % IV SOLN
INTRAVENOUS | Status: AC
  Administered 2016-11-19 – 2016-11-20 (×2): via INTRAVENOUS

## 2016-11-19 MED ORDER — METHYLPREDNISOLONE SODIUM SUCC 125 MG IJ SOLR
60.0000 mg | Freq: Once | INTRAMUSCULAR | Status: AC
  Administered 2016-11-19: 60 mg via INTRAVENOUS
  Filled 2016-11-19: qty 2

## 2016-11-19 MED ORDER — IPRATROPIUM-ALBUTEROL 0.5-2.5 (3) MG/3ML IN SOLN
3.0000 mL | Freq: Four times a day (QID) | RESPIRATORY_TRACT | Status: DC
  Administered 2016-11-19 – 2016-11-21 (×8): 3 mL via RESPIRATORY_TRACT
  Filled 2016-11-19 (×8): qty 3

## 2016-11-19 MED ORDER — ALBUTEROL SULFATE (2.5 MG/3ML) 0.083% IN NEBU
2.5000 mg | INHALATION_SOLUTION | RESPIRATORY_TRACT | Status: DC | PRN
  Filled 2016-11-19: qty 3

## 2016-11-19 MED ORDER — IPRATROPIUM-ALBUTEROL 0.5-2.5 (3) MG/3ML IN SOLN
3.0000 mL | Freq: Once | RESPIRATORY_TRACT | Status: AC
  Administered 2016-11-19: 3 mL via RESPIRATORY_TRACT
  Filled 2016-11-19: qty 3

## 2016-11-19 MED ORDER — VITAMIN D 1000 UNITS PO TABS
1000.0000 [IU] | ORAL_TABLET | Freq: Every day | ORAL | Status: DC
  Administered 2016-11-21 – 2016-11-22 (×2): 1000 [IU] via ORAL
  Filled 2016-11-19 (×2): qty 1

## 2016-11-19 MED ORDER — DEXTROSE 5 % IV SOLN
500.0000 mg | Freq: Once | INTRAVENOUS | Status: AC
  Administered 2016-11-19: 500 mg via INTRAVENOUS
  Filled 2016-11-19 (×2): qty 500

## 2016-11-19 MED ORDER — MORPHINE SULFATE (PF) 2 MG/ML IV SOLN: 2.0000 mg | INTRAVENOUS | Status: DC | PRN

## 2016-11-19 MED ORDER — ONDANSETRON HCL 4 MG PO TABS: 4.0000 mg | ORAL_TABLET | Freq: Four times a day (QID) | ORAL | Status: DC | PRN

## 2016-11-19 MED ORDER — METHOCARBAMOL 1000 MG/10ML IJ SOLN: 500.0000 mg | Freq: Four times a day (QID) | INTRAVENOUS | Status: DC | PRN

## 2016-11-19 MED ORDER — PANTOPRAZOLE SODIUM 40 MG PO TBEC
40.0000 mg | DELAYED_RELEASE_TABLET | Freq: Every day | ORAL | Status: DC
  Administered 2016-11-21 – 2016-11-22 (×2): 40 mg via ORAL
  Filled 2016-11-19 (×2): qty 1

## 2016-11-19 MED ORDER — DOCUSATE SODIUM 100 MG PO CAPS
100.0000 mg | ORAL_CAPSULE | Freq: Every day | ORAL | Status: DC
  Administered 2016-11-21 – 2016-11-22 (×2): 100 mg via ORAL
  Filled 2016-11-19 (×2): qty 1

## 2016-11-19 MED ORDER — SODIUM CHLORIDE 0.9 % IV SOLN
Freq: Once | INTRAVENOUS | Status: AC
  Administered 2016-11-19: 18:00:00 via INTRAVENOUS

## 2016-11-19 MED ORDER — DEXTROSE 5 % IV SOLN
1.0000 g | INTRAVENOUS | Status: DC
  Administered 2016-11-21: 1 g via INTRAVENOUS
  Filled 2016-11-19 (×3): qty 10

## 2016-11-19 MED ORDER — VITAMIN B-12 1000 MCG PO TABS
1000.0000 ug | ORAL_TABLET | Freq: Every day | ORAL | Status: DC
  Administered 2016-11-21 – 2016-11-22 (×2): 1000 ug via ORAL
  Filled 2016-11-19 (×2): qty 1

## 2016-11-19 MED ORDER — CEFAZOLIN SODIUM-DEXTROSE 1-4 GM/50ML-% IV SOLN
1.0000 g | Freq: Once | INTRAVENOUS | Status: AC
  Administered 2016-11-20: 1 g via INTRAVENOUS
  Filled 2016-11-19: qty 50

## 2016-11-19 MED ORDER — ACETAMINOPHEN 650 MG RE SUPP: 650.0000 mg | Freq: Four times a day (QID) | RECTAL | Status: DC | PRN

## 2016-11-19 MED ORDER — METHOCARBAMOL 500 MG PO TABS: 500.0000 mg | ORAL_TABLET | Freq: Four times a day (QID) | ORAL | Status: DC | PRN

## 2016-11-19 MED ORDER — FENTANYL CITRATE (PF) 100 MCG/2ML IJ SOLN
25.0000 ug | INTRAMUSCULAR | Status: DC | PRN
  Administered 2016-11-19: 25 ug via INTRAVENOUS
  Filled 2016-11-19: qty 2

## 2016-11-19 MED ORDER — LISINOPRIL 20 MG PO TABS
40.0000 mg | ORAL_TABLET | Freq: Every day | ORAL | Status: DC
  Administered 2016-11-21: 20 mg via ORAL
  Administered 2016-11-22: 40 mg via ORAL
  Filled 2016-11-19 (×2): qty 2

## 2016-11-19 MED ORDER — ONDANSETRON HCL 4 MG/2ML IJ SOLN: 4.0000 mg | Freq: Four times a day (QID) | INTRAMUSCULAR | Status: DC | PRN

## 2016-11-19 NOTE — ED Notes (Signed)
Pharm called at this time for azithromycin

## 2016-11-19 NOTE — ED Notes (Signed)
Pharm called at this time for azithromycin to be sent up, RX message sent at this time

## 2016-11-19 NOTE — ED Triage Notes (Signed)
Per EMS pt comes from home and experienced a fall around 11:00 this morning.  Pt did not hit head and did not lose consciousness.  Pt states 5/10 pain in left hip.  Pt is A&Ox4.

## 2016-11-19 NOTE — ED Notes (Signed)
Pts daughter [phone] asks for RN to call with a follow up.

## 2016-11-19 NOTE — ED Provider Notes (Signed)
[facility] Emergency Department Provider Note    First MD Initiated Contact with Patient 11/19/16 1559     (approximate)  I have reviewed the triage vital signs and the nursing notes.   HISTORY  Chief Complaint Fall    HPI Billy Choi is a 83 y.o. male presents after unwitnessed fall at home around 11 this morning. Patient cannot fully recall events preceding the fall. Does not reviewed his head or not. His only complaining out of moderate left hip pain. Denies any shortness of breath. States he does have a history of COPD. Does not wear home oxygen. No recent fevers. No numbness or tingling. Not on any blood thinners.   Past Medical History:  Diagnosis Date  . Hyperlipidemia    Family History  Problem Relation Age of Onset  . Arthritis Father    Past Surgical History:  Procedure Laterality Date  . CARDIAC SURGERY  1999  . HERNIA REPAIR     x2  . SKIN CANCER EXCISION  2016   Patient Active Problem List   Diagnosis Date Noted  . COPD with acute exacerbation ([place]) 11/19/2016  . Hypertension 02/09/2016  . Hypercholesterolemia 02/09/2016  . CAD (coronary artery disease) 08/01/2015  . COPD (chronic obstructive pulmonary disease) ([place]) 08/01/2015  . BPH (benign prostatic hyperplasia) 08/01/2015  . Irregular heart beat 08/01/2015  . DNR (do not resuscitate) 08/01/2015  . Atrial fibrillation ([place]) 08/01/2015      Prior to Admission medications   Medication Sig Start Date End Date Taking? Authorizing Provider  aspirin 325 MG tablet Take 162 mg by mouth daily.    Yes [provider]  cholecalciferol (VITAMIN D) 1000 units tablet Take 1,000 Units by mouth daily.   Yes [provider]  lisinopril (PRINIVIL,ZESTRIL) 40 MG tablet Take 40 mg by mouth daily.   Yes [provider]  Multiple Vitamins-Minerals (EQ COMPLETE MULTIVIT ADULT 50+) TABS Take 1 tablet by mouth daily.   Yes [provider]  simvastatin  (ZOCOR) 80 MG tablet Take 40 mg by mouth at bedtime.    Yes [provider]  vitamin B-12 (CYANOCOBALAMIN) 1000 MCG tablet Take 1,000 mcg by mouth daily.   Yes [provider]    Allergies Lipitor [atorvastatin]    Social History Social History  Substance Use Topics  . Smoking status: Former Research scientist (life sciences)  . Smokeless tobacco: Current User    Types: Chew  . Alcohol use Yes     Comment: wine on occasion    Review of Systems Patient denies headaches, rhinorrhea, blurry vision, numbness, shortness of breath, chest pain, edema, cough, abdominal pain, nausea, vomiting, diarrhea, dysuria, fevers, rashes or hallucinations unless otherwise stated above in HPI. ____________________________________________   PHYSICAL EXAM:  VITAL SIGNS: Vitals:   11/19/16 1957 11/19/16 2030  BP: 123/87   Pulse: 80   Resp: 20   Temp: 97.8 F (36.6 C)   SpO2: 96% 94%    Constitutional: Alert and oriented.  in no acute distress. Eyes: Conjunctivae are normal.  Head: Atraumatic. Nose: No congestion/rhinnorhea. Mouth/Throat: Mucous membranes are moist.   Neck: No stridor. Painless ROM.  Cardiovascular: Normal rate, regular rhythm. Grossly normal heart sounds.  Good peripheral circulation. Respiratory: mild tachypnea with diminished breathsounds throughout Gastrointestinal: Soft and nontender. No distention. No abdominal bruits. No CVA tenderness. Musculoskeletal: No lower extremity tenderness nor edema.  No joint effusions. Pain of left hip with log roll, no deformity noted Neurologic:  Normal speech and language. No gross  focal neurologic deficits are appreciated. No facial droop Skin:  Skin is warm, dry and intact. No rash noted. Psychiatric: Mood and affect are normal. Speech and behavior are normal.  ____________________________________________   LABS (all labs ordered are listed, but only abnormal results are displayed)  Results for orders placed or performed during the  hospital encounter of 11/19/16 (from the past 24 hour(s))  CBC with Differential/Platelet     Status: Abnormal   Collection Time: 11/19/16  4:56 PM  Result Value Ref Range   WBC 12.6 (H) 3.8 - 10.6 K/uL   RBC 4.93 4.40 - 5.90 MIL/uL   Hemoglobin 15.2 13.0 - 18.0 g/dL   HCT 44.9 40.0 - 52.0 %   MCV 90.9 80.0 - 100.0 fL   MCH 30.9 26.0 - 34.0 pg   MCHC 34.0 32.0 - 36.0 g/dL   RDW 13.5 11.5 - 14.5 %   Platelets 204 150 - 440 K/uL   Neutrophils Relative % 90 %   Neutro Abs 11.3 (H) 1.4 - 6.5 K/uL   Lymphocytes Relative 4 %   Lymphs Abs 0.5 (L) 1.0 - 3.6 K/uL   Monocytes Relative 6 %   Monocytes Absolute 0.8 0.2 - 1.0 K/uL   Eosinophils Relative 0 %   Eosinophils Absolute 0.0 0 - 0.7 K/uL   Basophils Relative 0 %   Basophils Absolute 0.0 0 - 0.1 K/uL  Comprehensive metabolic panel     Status: Abnormal   Collection Time: 11/19/16  4:56 PM  Result Value Ref Range   Sodium 138 135 - 145 mmol/L   Potassium 4.3 3.5 - 5.1 mmol/L   Chloride 99 (L) 101 - 111 mmol/L   CO2 29 22 - 32 mmol/L   Glucose, Bld 102 (H) 65 - 99 mg/dL   BUN 22 (H) 6 - 20 mg/dL   Creatinine, Ser 0.94 0.61 - 1.24 mg/dL   Calcium 9.3 8.9 - 10.3 mg/dL   Total Protein 7.1 6.5 - 8.1 g/dL   Albumin 4.2 3.5 - 5.0 g/dL   AST 28 15 - 41 U/L   ALT 17 17 - 63 U/L   Alkaline Phosphatase 55 38 - 126 U/L   Total Bilirubin 2.2 (H) 0.3 - 1.2 mg/dL   GFR calc non Af Amer >60 >60 mL/min   GFR calc Af Amer >60 >60 mL/min   Anion gap 10 5 - 15  Troponin I     Status: None   Collection Time: 11/19/16  4:56 PM  Result Value Ref Range   Troponin I <0.03 <0.03 ng/mL   ____________________________________________  EKG My review and personal interpretation at Time: 16:59   Indication: syncope  Rate: 80  Rhythm: sinus Axis: normal Other: rbbb, no stemi, normalintervals, ____________________________________________  RADIOLOGY I personally reviewed all radiographic images ordered to evaluate for the above acute complaints and  reviewed radiology reports and findings.  These findings were personally discussed with the patient.  Please see medical record for radiology report.  ____________________________________________   PROCEDURES  Procedure(s) performed:  Procedures    Critical Care performed: yes CRITICAL CARE Performed by: Merlyn Lot   Total critical care time: 30 minutes  Critical care time was exclusive of separately billable procedures and treating other patients.  Critical care was necessary to treat or prevent imminent or life-threatening deterioration.  Critical care was time spent personally by me on the following activities: development of treatment plan with patient and/or surrogate as well as nursing, discussions with consultants, evaluation of patient's response  to treatment, examination of patient, obtaining history from patient or surrogate, ordering and performing treatments and interventions, ordering and review of laboratory studies, ordering and review of radiographic studies, pulse oximetry and re-evaluation of patient's condition.  ____________________________________________   INITIAL IMPRESSION / ASSESSMENT AND PLAN / ED COURSE  Pertinent labs & imaging results that were available during my care of the patient were reviewed by me and considered in my medical decision making (see chart for details).  DDX: fracture, contusion, iph, syncope, hypoxia, copd exacerbation  CAMRIN GEARHEART is a 83 y.o. who presents to the ED with chief complaint of left hip pain after syncopal episode occurred today. Patient is amnestic to the fall. Patient noted to be hypoxic with a history of COPD with diminished breath sounds throughout. Quite consistent with COPD exacerbation and he suspect hypoxia cause the patient's syncopal fall. No focal neurodeficits. CT imaging of the head ordered to evaluate for bleed shows no acute abnormality.  The patient will be placed on continuous pulse oximetry and  telemetry for monitoring.  Laboratory evaluation will be sent to evaluate for the above complaints.     Clinical Course as of Nov 19 2344  Mon Nov 19, 2016  1702 My review of imaging shows evidence of left femoral neck fracture.  [PR]  1224 Given the patient's hypoxia with leukocytosis and chest x-ray showing possible infiltrates was treated the patient with Rocephin and is appropriate for possible community acquired pneumonia.  [PR]    Clinical Course User Index [PR] Merlyn Lot, MD     ____________________________________________   FINAL CLINICAL IMPRESSION(S) / ED DIAGNOSES  Final diagnoses:  Closed fracture of neck of left femur, initial encounter ([place])  Syncope and collapse  Acute respiratory failure with hypoxia ([place])      NEW MEDICATIONS STARTED DURING THIS VISIT:  Current Discharge Medication List       Note:  This document was prepared using Dragon voice recognition software and may include unintentional dictation errors.    Merlyn Lot, MD 11/19/16 [phone]

## 2016-11-19 NOTE — ED Notes (Signed)
transport by [employer], RN

## 2016-11-19 NOTE — Consult Note (Signed)
Displaced on lateral view, will need hemiarthroplasty tomorrow if medically stable

## 2016-11-19 NOTE — H&P (Signed)
[facility] at [place] NAME: Stormy Sabol    MR#:  [id number]  DATE OF BIRTH:  01/15/1922  DATE OF ADMISSION:  11/19/2016  PRIMARY CARE PHYSICIAN: [facility], Jefm Bryant   REQUESTING/REFERRING PHYSICIAN: Quentin Cornwall  CHIEF COMPLAINT:   Shortness of breath and fall HISTORY OF PRESENT ILLNESS:  Billy Choi  is a 83 y.o. male with a known history of chronic atrial fibrillation, hypertension, hyperlipidemia is brought into the ED after he sustained a fall. Patient was short of breath x-ray has revealed left greater than right infiltrates and also noticed wheezing. Patient is started on IV antibiotics and Solu-Medrol for bronchoconstriction. X-ray has revealed left hip fracture. Patient is resting comfortably during my examination  PAST MEDICAL HISTORY:   Past Medical History:  Diagnosis Date  . Hyperlipidemia   hypertension Chronic atrial fibrillation  PAST SURGICAL HISTOIRY:   Past Surgical History:  Procedure Laterality Date  . CARDIAC SURGERY  1999  . HERNIA REPAIR     x2  . SKIN CANCER EXCISION  2016    SOCIAL HISTORY:   Social History  Substance Use Topics  . Smoking status: Former Research scientist (life sciences)  . Smokeless tobacco: Current User    Types: Chew  . Alcohol use Yes     Comment: wine on occasion    FAMILY HISTORY:   Family History  Problem Relation Age of Onset  . Arthritis Father     DRUG ALLERGIES:   Allergies  Allergen Reactions  . Lipitor [Atorvastatin] Rash    REVIEW OF SYSTEMS:  CONSTITUTIONAL: No fever, fatigue or weakness.  EYES: No blurred or double vision.  EARS, NOSE, AND THROAT: No tinnitus or ear pain.  RESPIRATORY: No cough, reporting intermittent episodes ofshortness of breath, denies wheezing or hemoptysis.  CARDIOVASCULAR: No chest pain, orthopnea, edema.  GASTROINTESTINAL: No nausea, vomiting, diarrhea or abdominal pain.  GENITOURINARY: No dysuria, hematuria.  ENDOCRINE: No polyuria, nocturia,   HEMATOLOGY: No anemia, easy bruising or bleeding SKIN: No rash or lesion. MUSCULOSKELETAL: No joint pain or arthritis.   NEUROLOGIC: No tingling, numbness, weakness.  PSYCHIATRY: No anxiety or depression.   MEDICATIONS AT HOME:   Prior to Admission medications   Medication Sig Start Date End Date Taking? Authorizing Provider  aspirin 325 MG tablet Take 162 mg by mouth daily.    Yes [provider]  cholecalciferol (VITAMIN D) 1000 units tablet Take 1,000 Units by mouth daily.   Yes [provider]  lisinopril (PRINIVIL,ZESTRIL) 40 MG tablet Take 40 mg by mouth daily.   Yes [provider]  Multiple Vitamins-Minerals (EQ COMPLETE MULTIVIT ADULT 50+) TABS Take 1 tablet by mouth daily.   Yes [provider]  simvastatin (ZOCOR) 80 MG tablet Take 40 mg by mouth at bedtime.    Yes [provider]  vitamin B-12 (CYANOCOBALAMIN) 1000 MCG tablet Take 1,000 mcg by mouth daily.   Yes [provider]      VITAL SIGNS:  Blood pressure (!) 142/72, pulse 83, temperature 98 F (36.7 C), temperature source Oral, resp. rate (!) 27, height 5\' 7"  (1.702 m), weight 63.5 kg (140 lb), SpO2 91 %.  PHYSICAL EXAMINATION:  GENERAL:  83 y.o.-year-old patient lying in the bed with no acute distress.  EYES: Pupils equal, round, reactive to light and accommodation. No scleral icterus. Extraocular muscles intact.  HEENT: Head atraumatic, normocephalic. Oropharynx and nasopharynx clear.  NECK:  Supple, no jugular venous distention. No thyroid enlargement, no tenderness.  LUNGS: moderate breath  sounds bilaterally, minimal wheezing,positive crackles but no rales,rhonchi or crepitation. No use of accessory muscles of respiration.  CARDIOVASCULAR: S1, S2 normal. No murmurs, rubs, or gallops.  ABDOMEN: Soft, nontender, nondistended. Bowel sounds present. No organomegaly or mass.  EXTREMITIES:left hip is tender range of motion is limited  NEUROLOGIC: Cranial nerves  II through XII are intact. Muscle strength t his baselinein all extremities except left lower extremity Sensation intact. Gait not checked.  PSYCHIATRIC: The patient is alert and oriented x 3.  SKIN: No obvious rash, lesion, or ulcer.   LABORATORY PANEL:   CBC  Recent Labs Lab 11/19/16 1656  WBC 12.6*  HGB 15.2  HCT 44.9  PLT 204   ------------------------------------------------------------------------------------------------------------------  Chemistries   Recent Labs Lab 11/19/16 1656  NA 138  K 4.3  CL 99*  CO2 29  GLUCOSE 102*  BUN 22*  CREATININE 0.94  CALCIUM 9.3  AST 28  ALT 17  ALKPHOS 55  BILITOT 2.2*   ------------------------------------------------------------------------------------------------------------------  Cardiac Enzymes  Recent Labs Lab 11/19/16 1656  TROPONINI <0.03   ------------------------------------------------------------------------------------------------------------------  RADIOLOGY:  Dg Chest 1 View  Result Date: 11/19/2016 CLINICAL DATA:  Initial evaluation for acute hypoxia, concern for pneumonia. EXAM: CHEST 1 VIEW COMPARISON:  Prior radiograph from 02/13/2008. FINDINGS: Median sternotomy wires underlying CABG markers. Mild cardiomegaly. Mediastinal silhouette normal. Aortic atherosclerosis. Lungs hypoinflated. Patchy and linear bibasilar opacities, which may reflect atelectasis or infiltrates. Changes greater on the left. Underlying COPD. No pulmonary edema or pleural effusion. No pneumothorax. No acute osseus abnormality. IMPRESSION: 1. Shallow lung inflation with patchy and linear bibasilar opacities, left greater than right. Findings may reflect atelectasis or possibly infiltrates. 2. Underlying COPD. 3. Aortic atherosclerosis. Electronically Signed   By: Jeannine Boga M.D.   On: 11/19/2016 17:06   Ct Head Wo Contrast  Result Date: 11/19/2016 CLINICAL DATA:  83 year old male with head trauma following fall today.  Initial encounter. EXAM: CT HEAD WITHOUT CONTRAST TECHNIQUE: Contiguous axial images were obtained from the base of the skull through the vertex without intravenous contrast. COMPARISON:  None. FINDINGS: Brain: No evidence of acute infarction, hemorrhage, hydrocephalus, extra-axial collection or mass lesion/mass effect. Atrophy and mild chronic small-vessel white matter ischemic changes noted. Vascular: Intracranial atherosclerotic calcifications noted. Skull: Normal. Negative for fracture or focal lesion. Sinuses/Orbits: No acute finding. Other: None. IMPRESSION: 1. No evidence of acute intracranial abnormality 2. Atrophy and mild chronic small-vessel white matter ischemic changes. Electronically Signed   By: Margarette Canada M.D.   On: 11/19/2016 16:32   Dg Hip Unilat With Pelvis 2-3 Views Left  Result Date: 11/19/2016 CLINICAL DATA:  Initial evaluation for acute trauma, fall. EXAM: DG HIP (WITH OR WITHOUT PELVIS) 2-3V LEFT COMPARISON:  None. FINDINGS: Linear lucency with cortical irregularity extending through the left femoral neck, suspicious for acute subcapital fracture. No significant displacement. Femoral head remains normally position within the acetabulum. Femoral head height preserved. No acute osseus abnormality about the visualized bony pelvis and right hip. Osteopenia. Prominent degenerative changes noted within lower lumbar spine with sequelae of prior vertebral augmentation. No acute soft tissue abnormality. Prominent vascular calcifications. IMPRESSION: Linear lucency with cortical regularity extending through the left femoral neck, suspicious for acute nondisplaced subcapital fracture. Electronically Signed   By: Jeannine Boga M.D.   On: 11/19/2016 17:05    EKG:   Orders placed or performed during the hospital encounter of 11/19/16  . ED EKG  . ED EKG  . EKG 12-Lead  . EKG 12-Lead    IMPRESSION  AND PLAN:   Billy Choi  is a 83 y.o. male with a known history of chronic atrial  fibrillation, hypertension, hyperlipidemia is brought into the ED after he sustained a fall. Patient was short of breath x-ray has revealed left greater than right infiltrates and also noticed wheezing. Patient is started on IV antibiotics and Solu-Medrol for bronchoconstriction. X-ray has revealed left hip fracture.  # Community acquired pneumonia with bronchoconstriction Admit to med- surg  Sputum culture and sensitivity Rocephin and azithromycin Solu-Medrol and neb treatments  #Chronic history of atrial fibrillation rate controlled Aspirin will be on hold Cardiology consulted for cardiac clearance to optimize for surgery from cardiac standpoint  #essential hypertension Continue home medication lisinopril  #Fall and left hip fracture Nothing by mouth after midnight Pain management as needed Ortho - dr.Menz is aware  All the records are reviewed and case discussed with ED provider. Management plans discussed with the patient, daughter Clarene Critchley over phone  and they are in agreement.  CODE STATUS: DNR / daughter Gwendel Hanson   TOTAL TIME TAKING CARE OF THIS PATIENT: 45  minutes.   Note: This dictation was prepared with Dragon dictation along with smaller phrase technology. Any transcriptional errors that result from this process are unintentional.  Nicholes Mango M.D on 11/19/2016 at 6:40 PM  Between 7am to 6pm - Pager - [phone]  After 6pm go to [email or web address] - password [email or web address] [facility]  [place] [facility]  Office  [phone]  CC: Primary care physician; Katheren Shams

## 2016-11-20 ENCOUNTER — Inpatient Hospital Stay: Payer: PPO | Admitting: Anesthesiology

## 2016-11-20 ENCOUNTER — Inpatient Hospital Stay: Payer: PPO

## 2016-11-20 ENCOUNTER — Encounter: Payer: Self-pay | Admitting: Nurse Practitioner

## 2016-11-20 ENCOUNTER — Encounter
Admission: EM | Disposition: A | Discharge status: Skilled Nursing Facility | Payer: Self-pay | Source: Home / Self Care | Attending: Internal Medicine

## 2016-11-20 DIAGNOSIS — Z01810 Encounter for preprocedural cardiovascular examination: Secondary | ICD-10-CM

## 2016-11-20 DIAGNOSIS — I4891 Unspecified atrial fibrillation: Secondary | ICD-10-CM

## 2016-11-20 DIAGNOSIS — I10 Essential (primary) hypertension: Secondary | ICD-10-CM

## 2016-11-20 HISTORY — PX: HIP ARTHROPLASTY: SHX981

## 2016-11-20 LAB — BASIC METABOLIC PANEL
ANION GAP: 6 (ref 5–15)
BUN: 23 mg/dL — ABNORMAL HIGH (ref 6–20)
CO2: 27 mmol/L (ref 22–32)
Calcium: 8.6 mg/dL — ABNORMAL LOW (ref 8.9–10.3)
Chloride: 108 mmol/L (ref 101–111)
Creatinine, Ser: 0.84 mg/dL (ref 0.61–1.24)
GFR calc Af Amer: >60 mL/min (ref >60)
Glucose, Bld: 190 mg/dL — ABNORMAL HIGH (ref 65–99)
POTASSIUM: 3.9 mmol/L (ref 3.5–5.1)
SODIUM: 141 mmol/L (ref 135–145)

## 2016-11-20 LAB — CBC
HCT: 42.1 % (ref 40.0–52.0)
Hemoglobin: 14.5 g/dL (ref 13.0–18.0)
MCH: 32.1 pg (ref 26.0–34.0)
MCHC: 34.5 g/dL (ref 32.0–36.0)
MCV: 93.1 fL (ref 80.0–100.0)
PLATELETS: 158 10*3/uL (ref 150–440)
RBC: 4.52 MIL/uL (ref 4.40–5.90)
RDW: 13.4 % (ref 11.5–14.5)
WBC: 6.7 10*3/uL (ref 3.8–10.6)

## 2016-11-20 LAB — TSH: TSH: 0.803 u[IU]/mL (ref 0.350–4.500)

## 2016-11-20 LAB — SURGICAL PCR SCREEN
MRSA, PCR: NEGATIVE
Staphylococcus aureus: NEGATIVE

## 2016-11-20 SURGERY — HEMIARTHROPLASTY, HIP, DIRECT ANTERIOR APPROACH, FOR FRACTURE
Anesthesia: Spinal | Site: Hip | Laterality: Left | Wound class: Clean

## 2016-11-20 MED ORDER — PHENYLEPHRINE HCL 10 MG/ML IJ SOLN
INTRAMUSCULAR | Status: DC | PRN
  Administered 2016-11-20: 10 ug/min via INTRAVENOUS

## 2016-11-20 MED ORDER — FENTANYL CITRATE (PF) 100 MCG/2ML IJ SOLN: 25.0000 ug | INTRAMUSCULAR | Status: DC | PRN

## 2016-11-20 MED ORDER — MAGNESIUM CITRATE PO SOLN
1.0000 | Freq: Once | ORAL | Status: DC | PRN
  Filled 2016-11-20: qty 296

## 2016-11-20 MED ORDER — SODIUM CHLORIDE 0.9 % IV SOLN
INTRAVENOUS | Status: DC
  Administered 2016-11-20: 20:00:00 via INTRAVENOUS

## 2016-11-20 MED ORDER — ENOXAPARIN SODIUM 40 MG/0.4ML ~~LOC~~ SOLN
40.0000 mg | SUBCUTANEOUS | Status: DC
  Administered 2016-11-21 – 2016-11-22 (×2): 40 mg via SUBCUTANEOUS
  Filled 2016-11-20 (×2): qty 0.4

## 2016-11-20 MED ORDER — ONDANSETRON HCL 4 MG/2ML IJ SOLN: 4.0000 mg | Freq: Once | INTRAMUSCULAR | Status: DC | PRN

## 2016-11-20 MED ORDER — PROPOFOL 10 MG/ML IV BOLUS
INTRAVENOUS | Status: DC | PRN
  Administered 2016-11-20: 30 mg via INTRAVENOUS

## 2016-11-20 MED ORDER — MENTHOL 3 MG MT LOZG
1.0000 | LOZENGE | OROMUCOSAL | Status: DC | PRN
Start: 2016-11-20 — End: 2016-11-22
  Filled 2016-11-20: qty 9

## 2016-11-20 MED ORDER — CEFAZOLIN SODIUM-DEXTROSE 1-4 GM/50ML-% IV SOLN
1.0000 g | Freq: Four times a day (QID) | INTRAVENOUS | Status: AC
  Administered 2016-11-20 – 2016-11-21 (×2): 1 g via INTRAVENOUS
  Filled 2016-11-20 (×2): qty 50

## 2016-11-20 MED ORDER — MIDAZOLAM HCL 5 MG/5ML IJ SOLN
INTRAMUSCULAR | Status: DC | PRN
  Administered 2016-11-20 (×2): 1 mg via INTRAVENOUS

## 2016-11-20 MED ORDER — PHENOL 1.4 % MT LIQD
1.0000 | OROMUCOSAL | Status: DC | PRN
  Filled 2016-11-20: qty 177

## 2016-11-20 MED ORDER — ALUM & MAG HYDROXIDE-SIMETH 200-200-20 MG/5ML PO SUSP: 30.0000 mL | ORAL | Status: DC | PRN

## 2016-11-20 MED ORDER — BUPIVACAINE-EPINEPHRINE 0.25% -1:200000 IJ SOLN
INTRAMUSCULAR | Status: DC | PRN
  Administered 2016-11-20: 30 mL

## 2016-11-20 MED ORDER — PROPOFOL 500 MG/50ML IV EMUL
INTRAVENOUS | Status: DC | PRN
  Administered 2016-11-20: 30 ug/kg/min via INTRAVENOUS

## 2016-11-20 MED ORDER — MIDAZOLAM HCL 2 MG/2ML IJ SOLN
INTRAMUSCULAR | Status: AC
  Filled 2016-11-20: qty 2

## 2016-11-20 MED ORDER — LACTATED RINGERS IV SOLN
INTRAVENOUS | Status: DC | PRN
  Administered 2016-11-20: 17:00:00 via INTRAVENOUS

## 2016-11-20 MED ORDER — PROPOFOL 500 MG/50ML IV EMUL
INTRAVENOUS | Status: AC
  Filled 2016-11-20: qty 50

## 2016-11-20 MED ORDER — LIDOCAINE HCL (PF) 2 % IJ SOLN
INTRAMUSCULAR | Status: AC
  Filled 2016-11-20: qty 8

## 2016-11-20 MED ORDER — MAGNESIUM HYDROXIDE 400 MG/5ML PO SUSP: 30.0000 mL | Freq: Every day | ORAL | Status: DC | PRN

## 2016-11-20 MED ORDER — BUPIVACAINE-EPINEPHRINE (PF) 0.25% -1:200000 IJ SOLN
INTRAMUSCULAR | Status: AC
  Filled 2016-11-20: qty 30

## 2016-11-20 MED ORDER — ZOLPIDEM TARTRATE 5 MG PO TABS: 5.0000 mg | ORAL_TABLET | Freq: Every evening | ORAL | Status: DC | PRN

## 2016-11-20 MED ORDER — BISACODYL 10 MG RE SUPP
10.0000 mg | Freq: Every day | RECTAL | Status: DC | PRN
  Administered 2016-11-22: 10 mg via RECTAL
  Filled 2016-11-20: qty 1

## 2016-11-20 MED ORDER — NEOMYCIN-POLYMYXIN B GU 40-200000 IR SOLN
Status: DC | PRN
  Administered 2016-11-20: 4 mL

## 2016-11-20 MED ORDER — DIPHENHYDRAMINE HCL 12.5 MG/5ML PO ELIX: 12.5000 mg | ORAL_SOLUTION | ORAL | Status: DC | PRN

## 2016-11-20 MED ORDER — BUPIVACAINE HCL (PF) 0.5 % IJ SOLN
INTRAMUSCULAR | Status: DC | PRN
  Administered 2016-11-20: 2.8 mL

## 2016-11-20 MED ORDER — ACETAMINOPHEN 500 MG PO TABS
1000.0000 mg | ORAL_TABLET | Freq: Four times a day (QID) | ORAL | Status: AC
  Administered 2016-11-20: 1000 mg via ORAL
  Filled 2016-11-20 (×2): qty 2

## 2016-11-20 SURGICAL SUPPLY — 50 items
BLADE SAW SAG 18.5X105 (BLADE) ×3 IMPLANT
BNDG COHESIVE 6X5 TAN STRL LF (GAUZE/BANDAGES/DRESSINGS) ×9 IMPLANT
CANISTER SUCT 1200ML W/VALVE (MISCELLANEOUS) ×3 IMPLANT
CAPT HIP HEMI 2 ×3 IMPLANT
CATH TRAY METER 16FR LF (MISCELLANEOUS) ×3 IMPLANT
CHLORAPREP W/TINT 26ML (MISCELLANEOUS) ×3 IMPLANT
DRAPE C-ARM XRAY 36X54 (DRAPES) ×3 IMPLANT
DRAPE INCISE IOBAN 66X60 STRL (DRAPES) IMPLANT
DRAPE POUCH INSTRU U-SHP 10X18 (DRAPES) ×3 IMPLANT
DRAPE SHEET LG 3/4 BI-LAMINATE (DRAPES) ×9 IMPLANT
DRAPE TABLE BACK 80X90 (DRAPES) ×3 IMPLANT
DRESSING SURGICEL FIBRLLR 1X2 (HEMOSTASIS) ×2 IMPLANT
DRSG OPSITE POSTOP 4X8 (GAUZE/BANDAGES/DRESSINGS) ×6 IMPLANT
DRSG SURGICEL FIBRILLAR 1X2 (HEMOSTASIS) ×6
ELECT BLADE 6.5 EXT (BLADE) ×3 IMPLANT
ELECT REM PT RETURN 9FT ADLT (ELECTROSURGICAL) ×3
ELECTRODE REM PT RTRN 9FT ADLT (ELECTROSURGICAL) ×1 IMPLANT
EVACUATOR 1/8 PVC DRAIN (DRAIN) IMPLANT
GLOVE BIOGEL PI IND STRL 9 (GLOVE) ×1 IMPLANT
GLOVE BIOGEL PI INDICATOR 9 (GLOVE) ×2
GLOVE SURG SYN 9.0  PF PI (GLOVE) ×4
GLOVE SURG SYN 9.0 PF PI (GLOVE) ×2 IMPLANT
GOWN SRG 2XL LVL 4 RGLN SLV (GOWNS) ×1 IMPLANT
GOWN STRL NON-REIN 2XL LVL4 (GOWNS) ×2
GOWN STRL REUS W/ TWL LRG LVL3 (GOWN DISPOSABLE) ×1 IMPLANT
GOWN STRL REUS W/TWL LRG LVL3 (GOWN DISPOSABLE) ×2
HOLDER FOLEY CATH W/STRAP (MISCELLANEOUS) ×3 IMPLANT
HOOD PEEL AWAY FLYTE STAYCOOL (MISCELLANEOUS) ×3 IMPLANT
KIT PREVENA INCISION MGT 13 (CANNISTER) ×3 IMPLANT
MAT BLUE FLOOR 46X72 FLO (MISCELLANEOUS) ×3 IMPLANT
NDL SAFETY 18GX1.5 (NEEDLE) ×3 IMPLANT
NEEDLE SPNL 18GX3.5 QUINCKE PK (NEEDLE) ×3 IMPLANT
NS IRRIG 1000ML POUR BTL (IV SOLUTION) ×3 IMPLANT
PACK HIP COMPR (MISCELLANEOUS) ×3 IMPLANT
SOL PREP PVP 2OZ (MISCELLANEOUS) ×3
SOLUTION PREP PVP 2OZ (MISCELLANEOUS) ×1 IMPLANT
SPONGE DRAIN TRACH 4X4 STRL 2S (GAUZE/BANDAGES/DRESSINGS) ×3 IMPLANT
STAPLER SKIN PROX 35W (STAPLE) ×3 IMPLANT
STRAP SAFETY BODY (MISCELLANEOUS) ×3 IMPLANT
SUT DVC 2 QUILL PDO  T11 36X36 (SUTURE) ×2
SUT DVC 2 QUILL PDO T11 36X36 (SUTURE) ×1 IMPLANT
SUT SILK 0 (SUTURE) ×2
SUT SILK 0 30XBRD TIE 6 (SUTURE) ×1 IMPLANT
SUT V-LOC 90 ABS DVC 3-0 CL (SUTURE) ×3 IMPLANT
SUT VIC AB 1 CT1 36 (SUTURE) ×3 IMPLANT
SYR 20CC LL (SYRINGE) ×3 IMPLANT
SYR 30ML LL (SYRINGE) ×3 IMPLANT
TAPE MICROFOAM 4IN (TAPE) ×3 IMPLANT
TOWEL OR 17X26 4PK STRL BLUE (TOWEL DISPOSABLE) ×3 IMPLANT
WND VAC CANISTER 500ML (MISCELLANEOUS) ×3 IMPLANT

## 2016-11-20 NOTE — Consult Note (Signed)
Patient is a 83 year old who suffered a fall yesterday. He has been independent at home going to the grocery store and taking care of himself without assistive device. He suffered a fall yesterday and has a displaced femoral neck fracture there is minimal degenerative change  to the hip On exam his leg is minimally externally rotated he is able flex extend his toes skin about the hip is normalwithout ecchymosis X-rays show AP view it appears to be an impacted fracture but on the lateral there appears be posterior displacement of the fracture Impression is femoral neck fracture probably unstable Recommendation is for hip hemiarthroplasty discussed with him that Dr. Roland Rack could do thistoday and he will be full weightbearing expect him to require rehabilitation stay with him beingalone at home

## 2016-11-20 NOTE — Progress Notes (Signed)
Family called to notify that patient had left the floor for surgical procedure. No answer from either number.

## 2016-11-20 NOTE — Care Management (Signed)
Patient had appointment at [facility] this morning [phone]. Attempted to contact DMV [phone] which was cumbersome to get through all the right numbers and electronic notifications.  Patient is from home alone where he was independent from home. He has not DME available at home. [place] [phone]. RNCM will follow PT recommendation.

## 2016-11-20 NOTE — NC FL2 (Signed)
[place] LEVEL OF CARE SCREENING TOOL     IDENTIFICATION  Patient Name: Billy Choi Birthdate: 10-05-1921 Sex: male Admission Date (Current Location): 11/19/2016  [place] and [place] Number:  Engineering geologist and Address:  [facility], [address] [place], [place] [address]      Provider Number: [id number]  Attending Physician Name and Address:  Nicholes Mango, MD  Relative Name and Phone Number:       Current Level of Care: Hospital Recommended Level of Care: [place] Prior Approval Number:    Date Approved/Denied:   PASRR Number:   [id number]  Discharge Plan: SNF    Current Diagnoses: Patient Active Problem List   Diagnosis Date Noted  . COPD with acute exacerbation ([place]) 11/19/2016  . Hypertension 02/09/2016  . Hypercholesterolemia 02/09/2016  . CAD (coronary artery disease) 08/01/2015  . COPD (chronic obstructive pulmonary disease) ([place]) 08/01/2015  . BPH (benign prostatic hyperplasia) 08/01/2015  . Irregular heart beat 08/01/2015  . DNR (do not resuscitate) 08/01/2015  . Atrial fibrillation ([place]) 08/01/2015    Orientation RESPIRATION BLADDER Height & Weight     Self, Time, Situation, Place  Normal Continent Weight: 140 lb (63.5 kg) Height:  5\' 7"  (170.2 cm)  BEHAVIORAL SYMPTOMS/MOOD NEUROLOGICAL BOWEL NUTRITION STATUS      Continent Diet (NPO to be advanced)  AMBULATORY STATUS COMMUNICATION OF NEEDS Skin   Extensive Assist Verbally Normal                       Personal Care Assistance Level of Assistance  Bathing, Dressing, Feeding Bathing Assistance: Limited assistance Feeding assistance: Independent Dressing Assistance: Limited assistance     Functional Limitations Info  Sight, Hearing, Speech Sight Info: Adequate Hearing Info: Adequate Speech Info: Adequate    SPECIAL CARE FACTORS FREQUENCY  PT (By licensed PT), OT (By licensed OT)     PT Frequency:  (5) OT Frequency:   (5)            Contractures      Additional Factors Info  Code Status, Allergies Code Status Info:  (DNR) Allergies Info:  (LIPITOR ATORVASTATIN )           Current Medications (11/20/2016):  This is the current hospital active medication list Current Facility-Administered Medications  Medication Dose Route Frequency Provider Last Rate Last Dose  . 0.9 %  sodium chloride infusion   Intravenous Continuous Nicholes Mango, MD 75 mL/hr at 11/19/16 2123    . acetaminophen (TYLENOL) tablet 650 mg  650 mg Oral Q6H PRN Lakiesha Ralphs, MD       Or  . acetaminophen (TYLENOL) suppository 650 mg  650 mg Rectal Q6H PRN Adolf Ormiston, MD      . albuterol (PROVENTIL) (2.5 MG/3ML) 0.083% nebulizer solution 2.5 mg  2.5 mg Nebulization Q4H PRN Demaree Liberto, MD      . atorvastatin (LIPITOR) tablet 40 mg  40 mg Oral q1800 Cuba Natarajan, MD      . azithromycin (ZITHROMAX) 500 mg in dextrose 5 % 250 mL IVPB  500 mg Intravenous Q24H Deniel Mcquiston, MD      . ceFAZolin (ANCEF) IVPB 1 g/50 mL premix  1 g Intravenous Once Hessie Knows, MD      . cefTRIAXone (ROCEPHIN) 1 g in dextrose 5 % 50 mL IVPB  1 g Intravenous Q24H Lashona Schaaf, MD      . cholecalciferol (VITAMIN D) tablet 1,000 Units  1,000 Units  Oral Daily Alaster Asfaw, MD      . docusate sodium (COLACE) capsule 100 mg  100 mg Oral Daily Danner Paulding, MD      . fentaNYL (SUBLIMAZE) injection 25 mcg  25 mcg Intravenous Q1H PRN Merlyn Lot, MD   25 mcg at 11/19/16 1729  . HYDROcodone-acetaminophen (NORCO/VICODIN) 5-325 MG per tablet 1-2 tablet  1-2 tablet Oral Q6H PRN Hessie Knows, MD      . ipratropium-albuterol (DUONEB) 0.5-2.5 (3) MG/3ML nebulizer solution 3 mL  3 mL Nebulization Q6H Genice Kimberlin, MD   3 mL at 11/20/16 0804  . lisinopril (PRINIVIL,ZESTRIL) tablet 40 mg  40 mg Oral Daily Yariel Ferraris, MD      . methocarbamol (ROBAXIN) tablet 500 mg  500 mg Oral Q6H PRN Hessie Knows, MD       Or  . methocarbamol (ROBAXIN) 500 mg in dextrose 5 % 50  mL IVPB  500 mg Intravenous Q6H PRN Hessie Knows, MD      . methylPREDNISolone sodium succinate (SOLU-MEDROL) 40 mg/mL injection 40 mg  40 mg Intravenous Q8H Leann Mayweather, MD   40 mg at 11/20/16 0431  . morphine 2 MG/ML injection 2 mg  2 mg Intravenous Q4H PRN Katerra Ingman, MD      . multivitamin with minerals tablet 1 tablet  1 tablet Oral Daily Breeze Berringer, MD      . ondansetron (ZOFRAN) tablet 4 mg  4 mg Oral Q6H PRN Hermelinda Diegel, MD       Or  . ondansetron (ZOFRAN) injection 4 mg  4 mg Intravenous Q6H PRN Dartha Rozzell, MD      . pantoprazole (PROTONIX) EC tablet 40 mg  40 mg Oral Daily Dijuan Sleeth, MD      . polyethylene glycol (MIRALAX / GLYCOLAX) packet 17 g  17 g Oral Daily PRN Zuha Dejonge, MD      . traMADol (ULTRAM) tablet 50 mg  50 mg Oral Q6H PRN Kili Gracy, MD      . vitamin B-12 (CYANOCOBALAMIN) tablet 1,000 mcg  1,000 mcg Oral Daily Deaveon Schoen, MD         Discharge Medications: Please see discharge summary for a list of discharge medications.  Relevant Imaging Results:  Relevant Lab Results:   Additional Information  (SSN: [id number])  Smith Mince, Student-Social Work

## 2016-11-20 NOTE — Progress Notes (Signed)
[place] [facility] [place] NAME: Billy Choi    MR#:  [id number]  DATE OF BIRTH:  03-23-1921  SUBJECTIVE:  CHIEF COMPLAINT:    REVIEW OF SYSTEMS:  CONSTITUTIONAL: No fever, fatigue or weakness.  EYES: No blurred or double vision.  EARS, NOSE, AND THROAT: No tinnitus or ear pain.  RESPIRATORY: No cough, shortness of breath, wheezing or hemoptysis.  CARDIOVASCULAR: No chest pain, orthopnea, edema.  GASTROINTESTINAL: No nausea, vomiting, diarrhea or abdominal pain.  GENITOURINARY: No dysuria, hematuria.  ENDOCRINE: No polyuria, nocturia,  HEMATOLOGY: No anemia, easy bruising or bleeding SKIN: No rash or lesion. MUSCULOSKELETAL: No joint pain or arthritis.   NEUROLOGIC: No tingling, numbness, weakness.  PSYCHIATRY: No anxiety or depression.   DRUG ALLERGIES:   Allergies  Allergen Reactions  . Lipitor [Atorvastatin] Rash    VITALS:  Blood pressure (!) 120/51, pulse 69, temperature (!) 97.5 F (36.4 C), temperature source Oral, resp. rate 20, height 5\' 7"  (1.702 m), weight 63.5 kg (140 lb), SpO2 95 %.  PHYSICAL EXAMINATION:  GENERAL:  83 y.o.-year-old patient lying in the bed with no acute distress.  EYES: Pupils equal, round, reactive to light and accommodation. No scleral icterus. Extraocular muscles intact.  HEENT: Head atraumatic, normocephalic. Oropharynx and nasopharynx clear.  NECK:  Supple, no jugular venous distention. No thyroid enlargement, no tenderness.  LUNGS: Normal breath sounds bilaterally, no wheezing, rales,rhonchi or crepitation. No use of accessory muscles of respiration.  CARDIOVASCULAR: S1, S2 normal. No murmurs, rubs, or gallops.  ABDOMEN: Soft, nontender, nondistended. Bowel sounds present. No organomegaly or mass.  EXTREMITIES: No pedal edema, cyanosis, or clubbing.  NEUROLOGIC: Cranial nerves II through XII are intact. Muscle strength 5/5 in all extremities. Sensation intact. Gait not checked.  PSYCHIATRIC:  The patient is alert and oriented x 3.  SKIN: No obvious rash, lesion, or ulcer.    LABORATORY PANEL:   CBC  Recent Labs Lab 11/20/16 0429  WBC 6.7  HGB 14.5  HCT 42.1  PLT 158   ------------------------------------------------------------------------------------------------------------------  Chemistries   Recent Labs Lab 11/19/16 1656 11/20/16 0429  NA 138 141  K 4.3 3.9  CL 99* 108  CO2 29 27  GLUCOSE 102* 190*  BUN 22* 23*  CREATININE 0.94 0.84  CALCIUM 9.3 8.6*  AST 28  --   ALT 17  --   ALKPHOS 55  --   BILITOT 2.2*  --    ------------------------------------------------------------------------------------------------------------------  Cardiac Enzymes  Recent Labs Lab 11/19/16 1656  TROPONINI <0.03   ------------------------------------------------------------------------------------------------------------------  RADIOLOGY:  Dg Chest 1 View  Result Date: 11/19/2016 CLINICAL DATA:  Initial evaluation for acute hypoxia, concern for pneumonia. EXAM: CHEST 1 VIEW COMPARISON:  Prior radiograph from 02/13/2008. FINDINGS: Median sternotomy wires underlying CABG markers. Mild cardiomegaly. Mediastinal silhouette normal. Aortic atherosclerosis. Lungs hypoinflated. Patchy and linear bibasilar opacities, which may reflect atelectasis or infiltrates. Changes greater on the left. Underlying COPD. No pulmonary edema or pleural effusion. No pneumothorax. No acute osseus abnormality. IMPRESSION: 1. Shallow lung inflation with patchy and linear bibasilar opacities, left greater than right. Findings may reflect atelectasis or possibly infiltrates. 2. Underlying COPD. 3. Aortic atherosclerosis. Electronically Signed   By: Jeannine Boga M.D.   On: 11/19/2016 17:06   Ct Head Wo Contrast  Result Date: 11/19/2016 CLINICAL DATA:  83 year old male with head trauma following fall today. Initial encounter. EXAM: CT HEAD WITHOUT CONTRAST TECHNIQUE: Contiguous axial images  were obtained from the base of the skull through the  vertex without intravenous contrast. COMPARISON:  None. FINDINGS: Brain: No evidence of acute infarction, hemorrhage, hydrocephalus, extra-axial collection or mass lesion/mass effect. Atrophy and mild chronic small-vessel white matter ischemic changes noted. Vascular: Intracranial atherosclerotic calcifications noted. Skull: Normal. Negative for fracture or focal lesion. Sinuses/Orbits: No acute finding. Other: None. IMPRESSION: 1. No evidence of acute intracranial abnormality 2. Atrophy and mild chronic small-vessel white matter ischemic changes. Electronically Signed   By: Margarette Canada M.D.   On: 11/19/2016 16:32   Dg Hip Unilat With Pelvis 2-3 Views Left  Result Date: 11/19/2016 CLINICAL DATA:  Initial evaluation for acute trauma, fall. EXAM: DG HIP (WITH OR WITHOUT PELVIS) 2-3V LEFT COMPARISON:  None. FINDINGS: Linear lucency with cortical irregularity extending through the left femoral neck, suspicious for acute subcapital fracture. No significant displacement. Femoral head remains normally position within the acetabulum. Femoral head height preserved. No acute osseus abnormality about the visualized bony pelvis and right hip. Osteopenia. Prominent degenerative changes noted within lower lumbar spine with sequelae of prior vertebral augmentation. No acute soft tissue abnormality. Prominent vascular calcifications. IMPRESSION: Linear lucency with cortical regularity extending through the left femoral neck, suspicious for acute nondisplaced subcapital fracture. Electronically Signed   By: Jeannine Boga M.D.   On: 11/19/2016 17:05    EKG:   Orders placed or performed during the hospital encounter of 11/19/16  . ED EKG  . ED EKG  . EKG 12-Lead  . EKG 12-Lead    ASSESSMENT AND PLAN:   [name]  is a 83 y.o. male with a known history of chronic atrial fibrillation, hypertension, hyperlipidemia is brought into the ED after he sustained a  fall. Patient was short of breath x-ray has revealed left greater than right infiltrates and also noticed wheezing. Patient is started on IV antibiotics and Solu-Medrol for bronchoconstriction. X-ray has revealed left hip fracture.   # Community acquired pneumonia with bronchoconstriction Clinically better today  Sputum culture and sensitivity pending Rocephin and azithromycin Solu-Medrol and neb treatments will be continued  #Chronic history of atrial fibrillation rate controlled Aspirin will be on hold . Cardiology will address need for long-term oral anticoagulation postoperatively (CHA2DS2VASc equals 4). Cardiology [facility] consulted for cardiac clearance optimized with moderate risk for perioperative cardiac or complications from cardiac standpoint  #essential hypertension Continue home medication lisinopril  #Fall and left hip fracture Nothing by mouth after midnight Pain management as needed Ortho - dr.Menz is aware, recommending left hip hemiarthroplasty    All the records are reviewed and case discussed with Care Management/Social Workerr. Management plans discussed with the patient, family and they are in agreement.  CODE STATUS: dnr   TOTAL TIME TAKING CARE OF THIS PATIENT: 36 minutes.   POSSIBLE D/C IN 2 DAYS, DEPENDING ON CLINICAL CONDITION.  Note: This dictation was prepared with Dragon dictation along with smaller phrase technology. Any transcriptional errors that result from this process are unintentional.   Nicholes Mango M.D on 11/20/2016 at 1:29 PM  Between 7am to 6pm - Pager - [phone] After 6pm go to [email or web address] - password [email or web address] [facility]  [place] [facility]  Office  [phone]  CC: Primary care physician; Katheren Shams

## 2016-11-20 NOTE — Anesthesia Post-op Follow-up Note (Signed)
Anesthesia QCDR form completed.        

## 2016-11-20 NOTE — Clinical Social Work Placement (Signed)
   CLINICAL SOCIAL WORK PLACEMENT  NOTE  Date:  11/20/2016  Patient Details  Name: Billy Choi MRN: [id number] Date of Birth: 07-14-1921  Clinical Social Work is seeking post-discharge placement for this patient at the [place] level of care (*CSW will initial, date and re-position this form in  chart as items are completed):  Yes   Patient/family provided with [place] Work Department's list of facilities offering this level of care within the geographic area requested by the patient (or if unable, by the patient's family).  Yes   Patient/family informed of their freedom to choose among providers that offer the needed level of care, that participate in Medicare, Medicaid or managed care program needed by the patient, have an available bed and are willing to accept the patient.  Yes   Patient/family informed of [facility]'s ownership interest in [facility] and [facility], as well as of the fact that they are under no obligation to receive care at these facilities.  PASRR submitted to EDS on 11/20/16     PASRR number received on 11/20/16     Existing PASRR number confirmed on       FL2 transmitted to all facilities in geographic area requested by pt/family on 11/20/16     FL2 transmitted to all facilities within larger geographic area on       Patient informed that his/her managed care company has contracts with or will negotiate with certain facilities, including the following:            Patient/family informed of bed offers received.  Patient chooses bed at       Physician recommends and patient chooses bed at      Patient to be transferred to   on  .  Patient to be transferred to facility by       Patient family notified on   of transfer.  Name of family member notified:        PHYSICIAN       Additional Comment:    _______________________________________________ Kariem Wolfson, Veronia Beets, LCSW 11/20/2016, 3:08 PM

## 2016-11-20 NOTE — Clinical Social Work Note (Signed)
Clinical Social Work Assessment  Patient Details  Name: Billy Choi MRN: [id number] Date of Birth: 01/11/1922  Date of referral:  11/20/16               Reason for consult:  Facility Placement                Permission sought to share information with:  Facility Sport and exercise psychologist, Family Supports Permission granted to share information::  Yes, Verbal Permission Granted  Name::      [place]::   [place]  Relationship::     Contact Information:     Housing/Transportation Living arrangements for the past 2 months:  [place] of Information:  Adult Children, Power of Attorney Patient Interpreter Needed:  None Criminal Activity/Legal Involvement Pertinent to Current Situation/Hospitalization:  No - Comment as needed Significant Relationships:  Adult Children Lives with:  Self Do you feel safe going back to the place where you live?  Yes Need for family participation in patient care:  Yes (Comment)  Care giving concerns: Patient lives by himself at home in [place] Worker assessment / plan: Social work Theatre manager reviewed patients chart and noted that patient is having surgery today. Social work Theatre manager met with patient to discuss discharge plan. Patient gave social work intern permission to call his daughter Helene Kelp (cell [phone], home [phone]). Social work Theatre manager introduced self to patient and explained role of [place]. Patient did state that he lived by himself at home. Social work Theatre manager then contacted daughter on the phone to get more information. Helene Kelp shared that he does live by himself and that she is his HPOA. Social work Theatre manager explained the difference between [facility] and SNF. Helene Kelp stated that she does not know much about the facilities in [facility]. Social work Theatre manager shared some facilities and explained SNF placement and the process. Helene Kelp was agreeable to SNF search in [facility]. Social  work Theatre manager explained that [employer] will have to approve SNF. Daughter verbalized her understanding reported that patient was very independent prior to this fall and is still driving and grocery shopping. FL2 complete and faxed out. Social Work Theatre manager and [place] will follow up. Health Team care manager is aware of above.  Employment status:  Retired Patent attorney) PT Recommendations:  Not assessed at this time Information / Referral to community resources:  [place]  Patient/Family's Response to care: Daughter is agreeable to SNF placement if PT recommends it.  Patient/Family's Understanding of and Emotional Response to Diagnosis, Current Treatment, and Prognosis: Patient and daughter were both pleasant.  Emotional Assessment Appearance:  Appears stated age Attitude/Demeanor/Rapport:    Affect (typically observed):  Pleasant Orientation:  Oriented to Self, Oriented to Place, Oriented to  Time, Oriented to Situation Alcohol / Substance use:  Not Applicable Psych involvement (Current and /or in the community):  No (Comment)  Discharge Needs  Concerns to be addressed:  Discharge Planning Concerns Readmission within the last 30 days:  No Current discharge risk:  Dependent with Mobility Barriers to Discharge:  Continued Medical Work up   Smith Mince, Student-Social Work 11/20/2016, 2:55 PM

## 2016-11-20 NOTE — Anesthesia Procedure Notes (Signed)
Date/Time: 11/20/2016 6:03 PM Performed by: Nelda Marseille Pre-anesthesia Checklist: Patient identified, Emergency Drugs available, Suction available, Patient being monitored and Timeout performed Oxygen Delivery Method: Simple face mask

## 2016-11-20 NOTE — Consult Note (Signed)
Cardiology Consult    Patient ID: Billy Choi MRN: [id number], DOB/AGE: 03/24/1921   Admit date: 11/19/2016 Date of Consult: 11/20/2016  Primary Physician: Katheren Shams Primary Cardiologist: New - seen by Andree Coss, MD  Requesting Provider: Ricka Burdock  Patient Profile    Billy Choi is a 83 y.o. male with a history of CAD s/p CABG In 1999, Afib, hypertension, hyperlipidemia, and COPD, who is being seen today for the evaluation of preoperative vascular evaluation in the setting of left hip fracture at the request of Dr. Margaretmary Eddy.  Past Medical History   Past Medical History:  Diagnosis Date  . Atrial fibrillation ([place])    a. Seen on ecg 07/2015 - pt unaware of history and is not on [place]. Per outpt clinic note, pt refused eval. CHA2DS2VASc = 4.  . Basal cell carcinoma   . CAD (coronary artery disease)    a. 1999 s/p CABG @ [facility] - presumably x 3 (2 VG markers on CXR, presume LIMA used).  . COPD (chronic obstructive pulmonary disease) ([place])   . Hyperlipidemia   . Hypertension     Past Surgical History:  Procedure Laterality Date  . CORONARY ARTERY BYPASS GRAFT  1999  . [place]  . HERNIA REPAIR     x2  . SKIN CANCER EXCISION  2016     Allergies  Allergies  Allergen Reactions  . Lipitor [Atorvastatin] Rash    History of Present Illness    82 year old male with the above past medical history including coronary artery disease status post coronary artery bypass grafting in 1999, hypertension and hyperlipidemia, remote tobacco abuse, and COPD. He was previously followed at the [facility] but has not seen cardiology in many years. He is followed closely by primary care and in June 2017, he was noted to be in atrial fibrillation. That was a new diagnosis for him at that time. Per outpatient clinic note, he refused further evaluation. He subsequently changed his care to [facility] primary care.   I have reviewed more recent clinic notes and do not see  any mention of atrial fibrillation. Heart rate and rhythm are documented to be regular on office visit 11/13/2016.  Patient was in his usual state of health until the morning of September 24, when he was leaning forward looking at a hole in his wall where a TV used to be. He was planning on repairing this. He said he leaned forward to look at it and lost his balance and then fell forward and onto his left side. He did not experience any pain but was not able to get up. He says he was on the floor for approximately 4 hours prior to his daughter calling him at 3 PM. He says he had his phone with him when he fell but could not remember his daughter's phone number and therefore didn't call anyone. After his daughter called, she activated EMS and he was taken to the [facility] emergency department. There, he was found to be in rate controlled atrial fibrillation and hemodynamically stable. X-rays revealed a left femoral neck fracture. Head CT and chest x-ray were unrevealing. He was admitted and seen by orthopedic surgery with tentative plan for surgery later today. He remains in atrial fibrillation and is asymptomatic. He says he is not aware of a prior history of atrial fibrillation. He has never been on oral anticoagulation. He denies any recent history of chest pain or exertional dyspnea and notes that he generally gets  around at home pretty well.  Inpatient Medications    . atorvastatin  40 mg Oral q1800  . cholecalciferol  1,000 Units Oral Daily  . docusate sodium  100 mg Oral Daily  . ipratropium-albuterol  3 mL Nebulization Q6H  . lisinopril  40 mg Oral Daily  . methylPREDNISolone (SOLU-MEDROL) injection  40 mg Intravenous Q8H  . multivitamin with minerals  1 tablet Oral Daily  . pantoprazole  40 mg Oral Daily  . vitamin B-12  1,000 mcg Oral Daily    Family History    Family History  Problem Relation Age of Onset  . Arthritis Father     Social History    Social History   Social History    . Marital status: Unknown    Spouse name: N/A  . Number of children: N/A  . Years of education: N/A   Occupational History  . Retired    Social History Main Topics  . Smoking status: Former Smoker    Packs/day: 1.00    Years: 40.00  . Smokeless tobacco: Current User    Types: Chew     Comment: quite 50 yrs ago  . Alcohol use Yes     Comment: wine on occasion  . Drug use: No  . Sexual activity: No   Other Topics Concern  . Not on file   Social History Narrative   Lives in [place] by himself.  Does not routinely exercise but is active around his home w/o limitations.     Review of Systems    General:  No chills, fever, night sweats or weight changes.  Cardiovascular:  No chest pain, dyspnea on exertion, edema, orthopnea, palpitations, paroxysmal nocturnal dyspnea. Dermatological: No rash, lesions/masses Respiratory: No cough, dyspnea Urologic: No hematuria, dysuria Abdominal:   No nausea, vomiting, diarrhea, bright red blood per rectum, melena, or hematemesis Neurologic:  No visual changes, wkns, changes in mental status. Musculoskeletal: Left-sided leg weakness and inability to bear weight. He denies pain.  All other systems reviewed and are otherwise negative except as noted above.  Physical Exam    Blood pressure (!) 120/51, pulse 69, temperature (!) 97.5 F (36.4 C), temperature source Oral, resp. rate 20, height 5\' 7"  (1.702 m), weight 140 lb (63.5 kg), SpO2 95 %.  General: Pleasant, NAD Psych: Normal affect. Neuro: Alert and oriented X 3. Moves all extremities spontaneously. HEENT: Normal  Neck: Supple without bruits or JVD. Lungs:  Resp regular and unlabored, CTA. Heart: Irregularly irregular, no s3, s4, or murmurs. Abdomen: Soft, non-tender, non-distended, BS + x 4.  Extremities: No clubbing, cyanosis or edema. DP/PT/Radials 2+ and equal bilaterally.  Labs     Recent Labs  11/19/16 1656  TROPONINI <0.03   Lab Results  Component Value Date    WBC 6.7 11/20/2016   HGB 14.5 11/20/2016   HCT 42.1 11/20/2016   MCV 93.1 11/20/2016   PLT 158 11/20/2016     Recent Labs Lab 11/19/16 1656 11/20/16 0429  NA 138 141  K 4.3 3.9  CL 99* 108  CO2 29 27  BUN 22* 23*  CREATININE 0.94 0.84  CALCIUM 9.3 8.6*  PROT 7.1  --   BILITOT 2.2*  --   ALKPHOS 55  --   ALT 17  --   AST 28  --   GLUCOSE 102* 190*   Lab Results  Component Value Date   CHOL 141 02/09/2016   HDL 51 02/09/2016   LDLCALC 69 02/09/2016   TRIG 107 02/09/2016  Radiology Studies    Dg Chest 1 View  Result Date: 11/19/2016 CLINICAL DATA:  Initial evaluation for acute hypoxia, concern for pneumonia. EXAM: CHEST 1 VIEW COMPARISON:  Prior radiograph from 02/13/2008. FINDINGS: Median sternotomy wires underlying CABG markers. Mild cardiomegaly. Mediastinal silhouette normal. Aortic atherosclerosis. Lungs hypoinflated. Patchy and linear bibasilar opacities, which may reflect atelectasis or infiltrates. Changes greater on the left. Underlying COPD. No pulmonary edema or pleural effusion. No pneumothorax. No acute osseus abnormality. IMPRESSION: 1. Shallow lung inflation with patchy and linear bibasilar opacities, left greater than right. Findings may reflect atelectasis or possibly infiltrates. 2. Underlying COPD. 3. Aortic atherosclerosis. Electronically Signed   By: Jeannine Boga M.D.   On: 11/19/2016 17:06   Ct Head Wo Contrast  Result Date: 11/19/2016 CLINICAL DATA:  83 year old male with head trauma following fall today. Initial encounter. EXAM: CT HEAD WITHOUT CONTRAST TECHNIQUE: Contiguous axial images were obtained from the base of the skull through the vertex without intravenous contrast. COMPARISON:  None. FINDINGS: Brain: No evidence of acute infarction, hemorrhage, hydrocephalus, extra-axial collection or mass lesion/mass effect. Atrophy and mild chronic small-vessel white matter ischemic changes noted. Vascular: Intracranial atherosclerotic  calcifications noted. Skull: Normal. Negative for fracture or focal lesion. Sinuses/Orbits: No acute finding. Other: None. IMPRESSION: 1. No evidence of acute intracranial abnormality 2. Atrophy and mild chronic small-vessel white matter ischemic changes. Electronically Signed   By: Margarette Canada M.D.   On: 11/19/2016 16:32   Dg Hip Unilat With Pelvis 2-3 Views Left  Result Date: 11/19/2016 CLINICAL DATA:  Initial evaluation for acute trauma, fall. EXAM: DG HIP (WITH OR WITHOUT PELVIS) 2-3V LEFT COMPARISON:  None. FINDINGS: Linear lucency with cortical irregularity extending through the left femoral neck, suspicious for acute subcapital fracture. No significant displacement. Femoral head remains normally position within the acetabulum. Femoral head height preserved. No acute osseus abnormality about the visualized bony pelvis and right hip. Osteopenia. Prominent degenerative changes noted within lower lumbar spine with sequelae of prior vertebral augmentation. No acute soft tissue abnormality. Prominent vascular calcifications. IMPRESSION: Linear lucency with cortical regularity extending through the left femoral neck, suspicious for acute nondisplaced subcapital fracture. Electronically Signed   By: Jeannine Boga M.D.   On: 11/19/2016 17:05    ECG & Cardiac Imaging    Atrial fibrillation, 84, right bundle branch block, prior inferior infarct.  Assessment & Plan    1. Preoperative cardiovascular evaluation: Patient with prior history of coronary artery disease status post coronary artery bypass grafting in 1999. He also has a history of atrial fibrillation diagnosed in June 2017. It is unclear if this is paroxysmal or persistent. He has not oral anticoagulation and his most recent clinic notes from primary care do not mention a prior history of A. fib. He was admitted following a fall, after losing his balance while leaning forward at home yesterday. He denies presyncope or syncope. He fractured  his left femoral neck and is pending surgery later today. From a cardiac standpoint, he has not been experiencing chest pain or dyspnea. He gets around at home pretty well. He is at moderate risk for preoperative cardiovascular complications in the setting of advanced age.  Preoperative cardiovascular testing is not necessary at this time. Continue statin therapy throughout the perioperative period.   2. Left femoral neck fracture: See #1. Orthopedics following.  3. Atrial fibrillation: It is unclear if this is paroxysmal or persistent. As was noted on ECG in June 2017 and according to outpatient clinic note at that time,  patient refused further evaluation. He has since changed primary care providers and most recent notes of any reviewed. There is no mention of prior history of atrial fibrillation and most recent notes and examinations show regular rate and rhythm. He is well rate controlled currently without being on AV nodal blocking agents. Continue to follow rhythm. We will address need for long-term oral anticoagulation postoperatively (CHA2DS2VASc equals 4).  4. Essential hypertension: This is stable on ACE inhibitor therapy.  5. Hyperlipidemia: On statin.  Signed, Murray Hodgkins, NP 11/20/2016, 12:48 PM  For questions or updates, please contact   Please consult [email or web address] for contact info under Cardiology/STEMI.

## 2016-11-20 NOTE — Op Note (Signed)
11/19/2016 - 11/20/2016  6:45 PM  PATIENT:  Billy Choi  83 y.o. male  PRE-OPERATIVE DIAGNOSIS:  n/a left femoral neck fracture displaced  POST-OPERATIVE DIAGNOSIS:  hip fracture same  PROCEDURE:  Procedure(s): ARTHROPLASTY BIPOLAR HIP (HEMIARTHROPLASTY) (Left)  SURGEON: Laurene Footman, MD  ASSISTANTS: None  ANESTHESIA:   spinal  EBL:  Total I/O In: 1100 [I.V.:1100] Out: 950 [Urine:500; Blood:450]  BLOOD ADMINISTERED:none  DRAINS: none   LOCAL MEDICATIONS USED:  [place]     SPECIMEN:  Source of Specimen:  Left femoral head and neck  DISPOSITION OF SPECIMEN:  PATHOLOGY  COUNTS:  YES  TOURNIQUET:  * No tourniquets in log *  IMPLANTS: [place] for standard STEM with 53 bipolar head and M 28 mm metal head  DICTATION: .Dragon Dictation   The patient was brought to the operating room and after spinal anesthesia was obtained patient was placed on the operative table with the ipsilateral foot into the Medacta attachment, contralateral leg on a well-padded table. C-arm was brought in and preop template x-ray taken. After prepping and draping in usual sterile fashion appropriate patient identification and timeout procedures were completed. Anterior approach to the hip was obtained and centered over the greater trochanter and TFL muscle. The subcutaneous tissue was incised hemostasis being achieved by electrocautery. TFL fascia was incised and the muscle retracted laterally deep retractor placed. The lateral femoral circumflex vessels were identified and ligated. The anterior capsule was exposed and a capsulotomy performed. The neck was identified and a femoral neck cut carried out with a saw. The head was removed without difficulty and showed a displaced femoral neck fracture and mild arthritis to the hip. A 53 mm trial head was placed and this seemed to fit well so that was chosen as the implant The leg was then externally rotated and ischiofemoral and pubofemoral releases carried  out. The femur was sequentially broached to a size 4, size 4 standard trials were placed and the final components chosen. The 4 standard stem was inserted along with a M 28 mm head and 53 bipolar mm liner. The hip was reduced and was stable the wound was thoroughly irrigated with a dilute Betadine solution. The deep fascia was closed using a heavy [place] after infiltration of 30 cc of quarter percent Sensorcaine with epinephrine. 3-0 v-loc to close the skin with skin staples Xeroform and honeycomb dressing applied   PLAN OF CARE: Continue as inpatient

## 2016-11-20 NOTE — Progress Notes (Signed)
VSS. A&OX4. Denies pain at this time. In no acute distress. Will monitor.

## 2016-11-20 NOTE — Anesthesia Procedure Notes (Signed)
Spinal  Patient location during procedure: OR Start time: 11/20/2016 5:20 PM End time: 11/20/2016 5:40 PM Staffing Resident/CRNA: Nelda Marseille Performed: resident/CRNA  Preanesthetic Checklist Completed: patient identified, site marked, surgical consent, pre-op evaluation, timeout performed, IV checked, risks and benefits discussed and monitors and equipment checked Spinal Block Patient position: sitting Prep: Betadine Patient monitoring: heart rate, continuous pulse ox, blood pressure and cardiac monitor Approach: right paramedian Location: L4-5 Injection technique: single-shot Needle Needle type: [place] and Introducer  Needle gauge: 25 G Needle length: 9 cm Assessment Sensory level: T10 Additional Notes Negative paresthesia. Negative blood return. Positive free-flowing CSF. Expiration date of kit checked and confirmed. Patient tolerated procedure well, without complications.

## 2016-11-20 NOTE — Anesthesia Preprocedure Evaluation (Addendum)
Anesthesia Evaluation  Patient identified by MRN, date of birth, ID band Patient awake    Reviewed: Allergy & Precautions, NPO status , Patient's Chart, lab work & pertinent test results  Airway Mallampati: II  TM Distance: >3 FB     Dental  (+) Lower Dentures, Upper Dentures   Pulmonary COPD,  COPD inhaler, former smoker,     + wheezing      Cardiovascular hypertension, Pt. on medications + CAD  Normal cardiovascular exam+ dysrhythmias Atrial Fibrillation      Neuro/Psych negative neurological ROS  negative psych ROS   GI/Hepatic negative GI ROS, Neg liver ROS,   Endo/Other  negative endocrine ROS  Renal/GU negative Renal ROS     Musculoskeletal negative musculoskeletal ROS (+)   Abdominal Normal abdominal exam  (+)   Peds negative pediatric ROS (+)  Hematology negative hematology ROS (+)   Anesthesia Other Findings Past Medical History: No date: Atrial fibrillation ([place])     Comment:  a. Seen on ecg 07/2015 - pt unaware of history and is not              on [place]. Per outpt clinic note, pt refused eval.               CHA2DS2VASc = 4. No date: Basal cell carcinoma No date: CAD (coronary artery disease)     Comment:  a. 1999 s/p CABG @ [facility] - presumably x 3 (2 VG markers               on CXR, presume LIMA used). No date: COPD (chronic obstructive pulmonary disease) ([facility]) No date: Hyperlipidemia No date: Hypertension  Reproductive/Obstetrics                           Anesthesia Physical Anesthesia Plan  ASA: III  Anesthesia Plan: Spinal   Post-op Pain Management:    Induction: Intravenous  PONV Risk Score and Plan:   Airway Management Planned: Nasal Cannula  Additional Equipment:   Intra-op Plan:   Post-operative Plan:   Informed Consent: I have reviewed the patients History and Physical, chart, labs and discussed the procedure including the risks, benefits and alternatives  for the proposed anesthesia with the patient or authorized representative who has indicated his/her understanding and acceptance.   Dental advisory given  Plan Discussed with: CRNA and Surgeon  Anesthesia Plan Comments:        Anesthesia Quick Evaluation

## 2016-11-20 NOTE — Transfer of Care (Signed)
Immediate Anesthesia Transfer of Care Note  Patient: Billy Choi  Procedure(s) Performed: Procedure(s): ARTHROPLASTY BIPOLAR HIP (HEMIARTHROPLASTY) (Left)  Patient Location: PACU  Anesthesia Type:Spinal  Level of Consciousness: sedated  Airway & Oxygen Therapy: Patient Spontanous Breathing and Patient connected to face mask oxygen  Post-op Assessment: Report given to RN and Post -op Vital signs reviewed and stable  Post vital signs: Reviewed and stable  Last Vitals:  Vitals:   11/20/16 1316 11/20/16 1602  BP:  (!) 132/59  Pulse:  68  Resp:  18  Temp:  36.5 C  SpO2: 95% 97%    Last Pain:  Vitals:   11/20/16 1602  TempSrc: Oral  PainSc:          Complications: No apparent anesthesia complications

## 2016-11-20 NOTE — Progress Notes (Signed)
Initial Nutrition Assessment  DOCUMENTATION CODES:   Severe malnutrition in context of chronic illness  INTERVENTION:  1. Continue MVI w/ Minerals  2. Recommend Ensure Enlive po BID, each supplement provides 350 kcal and 20 grams of protein with diet advancement  NUTRITION DIAGNOSIS:   Malnutrition related to chronic illness as evidenced by severe depletion of muscle mass, severe depletion of body fat  GOAL:   Patient will meet greater than or equal to 90% of their needs  MONITOR:   PO intake, I & O's, Labs, Supplement acceptance, Weight trends  REASON FOR ASSESSMENT:   Malnutrition Screening Tool    ASSESSMENT:   [name] is a 83 yo male with a PMH of HTN, HLD, A-Fib presents with mechanical fall, CAP Found to have hip Fx  Spoke with [name] at bedside. Unsure of accuracy of history, patient states "my memory is bad." He reports eating "whatever I want" or whatever he can get his hands on, for meals. He states he eats as many meals as he wants daily, "sometimes 2, sometimes 5." Patient was unable to provide more detailed information. He reports a usual body weight of 150 pounds with a weight loss of 4-5 pounds over the past 4-5 months, insignificant for timeframe. Currently NPO for surgery this evening. Did not have breakfast this morning or dinner yesterday.  Nutrition-Focused physical exam completed. Findings are severe fat depletion, severe muscle depletion, and no edema.   Labs reviewed  Medications reviewed and include:  Vitamin D, Colace, Solumedrol, Vitamin B12  Intake/Output Summary (Last 24 hours) at 11/20/16 1453 Last data filed at 11/20/16 0555  Gross per 24 hour  Intake           546.25 ml  Output              250 ml  Net           296.25 ml    Diet Order:  Diet NPO time specified Diet NPO time specified  Skin:  Wound (see comment) (ecchymosis to R arm L leg, blanchable R buttock)  Last BM:  PTA  Height:   Ht Readings from Last 1 Encounters:   11/19/16 5\' 7"  (1.702 m)    Weight:   Wt Readings from Last 1 Encounters:  11/19/16 140 lb (63.5 kg)    Ideal Body Weight:  67.27 kg  BMI:  Body mass index is 21.93 kg/m.  Estimated Nutritional Needs:   Kcal:  1300-1500 calories (MSJ x.1.2-1.4)  Protein:  96-108 grams (1.5-1.7g/kg)  Fluid:  >1.5L  EDUCATION NEEDS:   Education needs no appropriate at this time  Satira Anis. Billy Hobbs, MS, RD LDN Inpatient Clinical Dietitian Pager [phone]

## 2016-11-21 ENCOUNTER — Encounter: Payer: Self-pay | Admitting: Orthopedic Surgery

## 2016-11-21 LAB — CBC
HEMATOCRIT: 34.5 % — AB (ref 40.0–52.0)
Hemoglobin: 11.7 g/dL — ABNORMAL LOW (ref 13.0–18.0)
MCH: 30.8 pg (ref 26.0–34.0)
MCHC: 33.8 g/dL (ref 32.0–36.0)
MCV: 91.3 fL (ref 80.0–100.0)
Platelets: 186 10*3/uL (ref 150–440)
RBC: 3.78 MIL/uL — ABNORMAL LOW (ref 4.40–5.90)
RDW: 13.8 % (ref 11.5–14.5)
WBC: 15.7 10*3/uL — ABNORMAL HIGH (ref 3.8–10.6)

## 2016-11-21 LAB — BASIC METABOLIC PANEL
Anion gap: 4 — ABNORMAL LOW (ref 5–15)
BUN: 31 mg/dL — ABNORMAL HIGH (ref 6–20)
CALCIUM: 8.2 mg/dL — AB (ref 8.9–10.3)
CHLORIDE: 109 mmol/L (ref 101–111)
CO2: 26 mmol/L (ref 22–32)
CREATININE: 1.01 mg/dL (ref 0.61–1.24)
GFR calc Af Amer: >60 mL/min (ref >60)
GFR calc non Af Amer: >60 mL/min (ref >60)
GLUCOSE: 153 mg/dL — AB (ref 65–99)
Potassium: 4.2 mmol/L (ref 3.5–5.1)
Sodium: 139 mmol/L (ref 135–145)

## 2016-11-21 LAB — LEGIONELLA PNEUMOPHILA SEROGP 1 UR AG: L. pneumophila Serogp 1 Ur Ag: NEGATIVE

## 2016-11-21 MED ORDER — METHYLPREDNISOLONE SODIUM SUCC 40 MG IJ SOLR
40.0000 mg | Freq: Two times a day (BID) | INTRAMUSCULAR | Status: DC
  Administered 2016-11-21 – 2016-11-22 (×2): 40 mg via INTRAVENOUS
  Filled 2016-11-21 (×2): qty 1

## 2016-11-21 MED ORDER — IPRATROPIUM-ALBUTEROL 0.5-2.5 (3) MG/3ML IN SOLN
3.0000 mL | Freq: Three times a day (TID) | RESPIRATORY_TRACT | Status: DC
  Administered 2016-11-22 (×2): 3 mL via RESPIRATORY_TRACT
  Filled 2016-11-21 (×2): qty 3

## 2016-11-21 NOTE — Progress Notes (Signed)
Physical Therapy Treatment Patient Details Name: Billy Choi MRN: [id number] DOB: 1922-02-23 Today's Date: 11/21/2016    History of Present Illness Billy Choi is a 83 y.o. male with a known history of chronic atrial fibrillation, hypertension, hyperlipidemia is brought into the ED after he sustained a fall. Patient was short of breath x-ray has revealed left greater than right infiltrates and also noticed wheezing. Patient is started on IV antibiotics and Solu-Medrol for bronchoconstriction. X-ray has revealed left hip fracture. He is now POD#1 s/p L hemiarthroplasty.     PT Comments    Pt continues to demonstrate good effort and motivation with therapy. He continues to require considerable assistance for bed mobility, transfers, and very limited ambulation. He is still only able to take a few small steps from recliner to bed. Cues required again for proper sequencing with walker. Pt continues to fall backwards and requires modA+1 for balance. Poor safety awareness and inability to self correct for balance He is able to complete all supine exercises as instructed by therapy. He will need SNF placement at discharge to facilitate safe return home. Pt will benefit from PT services to address deficits in strength, balance, and mobility in order to return to full function at home.     Follow Up Recommendations  SNF     Equipment Recommendations  Other (comment) (TBD at next venue)    Recommendations for Other Services       Precautions / Restrictions Precautions Precautions: Anterior Hip;Fall Precaution Booklet Issued: Yes (comment) Restrictions Weight Bearing Restrictions: Yes LLE Weight Bearing: Weight bearing as tolerated    Mobility  Bed Mobility Overal bed mobility: Needs Assistance Bed Mobility: Sit to Supine     Supine to sit: Mod assist Sit to supine: Mod assist   General bed mobility comments: Pt requires assist to move bilateral LEs back into bed when going from sitting  to supine.   Transfers Overall transfer level: Needs assistance Equipment used: Rolling walker (2 wheeled) Transfers: Sit to/from Stand Sit to Stand: Mod assist         General transfer comment: Pt continues to require cues for safe hand placement and assist to come to standing and for balance. Increased time to perform due to weakness and decreased weight shifting to LLE  Ambulation/Gait Ambulation/Gait assistance: Mod assist Ambulation Distance (Feet): 3 Feet Assistive device: Rolling walker (2 wheeled) Gait Pattern/deviations: Decreased step length - right;Decreased step length - left;Decreased weight shift to left Gait velocity: Decreased Gait velocity interpretation: <1.8 ft/sec, indicative of risk for recurrent falls General Gait Details: Pt is still only able to take a few small steps from recliner to bed. Cues required again for proper sequencing with walker. Pt continues to fall backwards and requires modA+1 for balance. Poor safety awareness and inability to self correct for balance   Stairs            Wheelchair Mobility    Modified Rankin (Stroke Patients Only)       Balance Overall balance assessment: Needs assistance Sitting-balance support: No upper extremity supported Sitting balance-Leahy Scale: Fair     Standing balance support: No upper extremity supported Standing balance-Leahy Scale: Poor                              Cognition Arousal/Alertness: Awake/alert Behavior During Therapy: WFL for tasks assessed/performed Overall Cognitive Status: No family/caregiver present to determine baseline cognitive functioning  General Comments: AOx2, disoriented to month but oriented to year. Only partially oriented to situation      Exercises Total Joint Exercises Ankle Circles/Pumps: AROM;Both;10 reps;Supine;Other (comment) (2 sets) Quad Sets: Strengthening;Both;10 reps;Supine;Other (comment) (2  sets) Gluteal Sets: Strengthening;Both;10 reps;Supine;Other (comment) (2 sets) Towel Squeeze: Strengthening;Both;10 reps;Supine;Other (comment) (2 sets) Short Arc Quad: Strengthening;Left;10 reps;Supine;Other (comment) (2 sets) Heel Slides: Strengthening;Left;10 reps;Supine;Other (comment) (2 sets) Hip ABduction/ADduction: Strengthening;Both;10 reps;Supine;Other (comment) (2 sets) Straight Leg Raises: Strengthening;Left;10 reps;Supine;Other (comment) (2 sets)    General Comments        Pertinent Vitals/Pain Pain Assessment: No/denies pain Pain Score: 10-Worst pain ever Pain Location: Denies L hip or L heel pain this afternoon Pain Intervention(s): Monitored during session;Other (comment) (RN aware)    Home Living Family/patient expects to be discharged to:: Skilled nursing facility Living Arrangements: Alone Available Help at Discharge: Family Type of Home: House Home Access: Stairs to enter Entrance Stairs-Rails: Left Home Layout: Multi-level;Able to live on main level with bedroom/bathroom Home Equipment: Crutches Additional Comments: No walker, cane, shower seat    Prior Function Level of Independence: Independent      Comments: Pt reports independent community ambulation witout assistive device. 2 falls in the last 12 months. Independent with ADLs/IADLs. Dirves   PT Goals (current goals can now be found in the care plan section) Acute Rehab PT Goals Patient Stated Goal: Return to prior function  PT Goal Formulation: With patient Time For Goal Achievement: 12/05/16 Potential to Achieve Goals: Fair Progress towards PT goals: Progressing toward goals    Frequency    BID      PT Plan Current plan remains appropriate    Co-evaluation              AM-PAC PT "6 Clicks" Daily Activity  Outcome Measure  Difficulty turning over in bed (including adjusting bedclothes, sheets and blankets)?: Unable Difficulty moving from lying on back to sitting on the side of  the bed? : Unable Difficulty sitting down on and standing up from a chair with arms (e.g., wheelchair, bedside commode, etc,.)?: Unable Help needed moving to and from a bed to chair (including a wheelchair)?: A Lot Help needed walking in hospital room?: A Lot Help needed climbing 3-5 steps with a railing? : Total 6 Click Score: 8    End of Session Equipment Utilized During Treatment: Gait belt Activity Tolerance: Patient tolerated treatment well Patient left: in bed;with bed alarm set;with call bell/phone within reach;with SCD's reapplied Nurse Communication: Other (comment) (Hasn't had BM since Sunday per pt report) PT Visit Diagnosis: Unsteadiness on feet (R26.81);Other abnormalities of gait and mobility (R26.89);History of falling (Z91.81);Muscle weakness (generalized) (M62.81);Pain Pain - Right/Left: Left Pain - part of body: Hip     Time: 1317-1340 PT Time Calculation (min) (ACUTE ONLY): 23 min  Charges:  $Gait Training: 8-22 mins $Therapeutic Exercise: 8-22 mins                    G Codes:  Functional Assessment Tool Used: AM-PAC 6 Clicks Basic Mobility Functional Limitation: Mobility: Walking and moving around Mobility: Walking and Moving Around Current Status (W4132): At least 80 percent but less than 100 percent impaired, limited or restricted Mobility: Walking and Moving Around Goal Status [phone]): At least 20 percent but less than 40 percent impaired, limited or restricted    Phillips Grout PT, DPT     Billy Choi 11/21/2016, 1:54 PM

## 2016-11-21 NOTE — Progress Notes (Signed)
Pt alert and oriented. VSS. Pt eating and drinking well at beginning of shift. Placed IV at [facility]. Pt urine output is 100 cc for this shift. MD paged. IV fluids placed back at 100 cc/hr. Per MD will wait to D/C foley.

## 2016-11-21 NOTE — Progress Notes (Signed)
Updated MD (Gouru) on patient status, per MD dc IV fluids and proceed with discontinuation of the foley

## 2016-11-21 NOTE — Evaluation (Signed)
Physical Therapy Evaluation Patient Details Name: Billy Choi MRN: [id number] DOB: June 07, 1921 Today's Date: 11/21/2016   History of Present Illness  Billy Choi is a 83 y.o. male with a known history of chronic atrial fibrillation, hypertension, hyperlipidemia is brought into the ED after he sustained a fall. Patient was short of breath x-ray has revealed left greater than right infiltrates and also noticed wheezing. Patient is started on IV antibiotics and Solu-Medrol for bronchoconstriction. X-ray has revealed left hip fracture. He is now POD#1 s/p L hemiarthroplasty.   Clinical Impression  Pt admitted with above diagnosis. Pt currently with functional limitations due to the deficits listed below (see PT Problem List).  Pt requires modA+1 for bed mobility and modA+2 for sit to stand transfers. He is able to take small shuffling steps from bed to recliner with rolling walker and modA+2 support from therapist and tech. He is provided cues and education for proper sequencing with rolling walker Patient with heavy posterior leaning requiring considerable support to prevent falling. Unable to walk farther at this time. Patient is unsafe at this time to return home and will need skilled nursing facility placement at discharge. Pt will benefit from PT services to address deficits in strength, balance, and mobility in order to return to full function at home after SNF placement.       Follow Up Recommendations SNF    Equipment Recommendations  Other (comment) (TBD at next venue)    Recommendations for Other Services       Precautions / Restrictions Precautions Precautions: Anterior Hip;Fall Precaution Booklet Issued: Yes (comment) Restrictions Weight Bearing Restrictions: Yes LLE Weight Bearing: Weight bearing as tolerated      Mobility  Bed Mobility Overal bed mobility: Needs Assistance Bed Mobility: Supine to Sit     Supine to sit: Mod assist     General bed mobility comments: Head  of bed elevated and bed rails utilized. Patient requires cues for proper sequencing with mobility. Assistance for left lower extremity and trunk to come upright to sitting.  Transfers Overall transfer level: Needs assistance Equipment used: Rolling walker (2 wheeled) Transfers: Sit to/from Stand Sit to Stand: Mod assist;+2 physical assistance         General transfer comment: Patient requiring assistance to come upright in the standing. Once upright he is unable to remain standing without moderate support due to posterior leaning and loss of balance. Patient reports increase in left hip pain in standing.  Ambulation/Gait Ambulation/Gait assistance: Mod assist;+2 physical assistance Ambulation Distance (Feet): 3 Feet Assistive device: Rolling walker (2 wheeled) Gait Pattern/deviations: Decreased step length - right;Decreased step length - left;Decreased weight shift to left Gait velocity: Decreased Gait velocity interpretation: <1.8 ft/sec, indicative of risk for recurrent falls General Gait Details: Patient is able to take small shuffling steps from bed to recliner with rolling walker and modA+2 support from therapist and tech. He is provided cues and education for proper sequencing with rolling walker Patient with heavy posterior leaning requiring considerable support to prevent falling. Unable to walk farther at this time.  Stairs            Wheelchair Mobility    Modified Rankin (Stroke Patients Only)       Balance Overall balance assessment: Needs assistance Sitting-balance support: No upper extremity supported Sitting balance-Leahy Scale: Fair     Standing balance support: No upper extremity supported Standing balance-Leahy Scale: Poor  Pertinent Vitals/Pain Pain Assessment: 0-10 Pain Score: 10-Worst pain ever Pain Location: L heel on the ventral aspect, denies L hip pain at rest but reports some mild increase in pain  with standing Pain Intervention(s): Monitored during session;Other (comment) (RN aware)    Home Living Family/patient expects to be discharged to:: Skilled nursing facility Living Arrangements: Alone Available Help at Discharge: Family Type of Home: House Home Access: Stairs to enter Entrance Stairs-Rails: Left Entrance Stairs-Number of Steps: 3 Home Layout: Multi-level;Able to live on main level with bedroom/bathroom Home Equipment: Crutches Additional Comments: No walker, cane, shower seat    Prior Function Level of Independence: Independent         Comments: Pt reports independent community ambulation witout assistive device. 2 falls in the last 12 months. Independent with ADLs/IADLs. Dirves     Hand Dominance   Dominant Hand: Right    Extremity/Trunk Assessment   Upper Extremity Assessment Upper Extremity Assessment: Overall WFL for tasks assessed    Lower Extremity Assessment Lower Extremity Assessment: LLE deficits/detail LLE Deficits / Details: Patient reports 10 out of 10 left heel pain. No tenderness over posterior heel which is in contact with bed. No redness or skin breakdown evident. Patient requires assistance for straight leg raise in supine. He is able to perform a short arc quad without assistance.       Communication   Communication: HOH  Cognition Arousal/Alertness: Awake/alert Behavior During Therapy: WFL for tasks assessed/performed Overall Cognitive Status: No family/caregiver present to determine baseline cognitive functioning                                 General Comments: AOx2, disoriented to month but oriented to year. Only partially oriented to situation      General Comments      Exercises Total Joint Exercises Ankle Circles/Pumps: AROM;Both;10 reps;Supine Quad Sets: Strengthening;Both;10 reps;Supine Gluteal Sets: Strengthening;Both;10 reps;Supine Towel Squeeze: Strengthening;Both;10 reps;Supine Short Arc Quad:  Strengthening;Left;10 reps;Supine Heel Slides: Strengthening;Left;10 reps;Supine Hip ABduction/ADduction: Strengthening;Both;10 reps;Supine Straight Leg Raises: Strengthening;Left;10 reps;Supine   Assessment/Plan    PT Assessment Patient needs continued PT services  PT Problem List Decreased strength;Decreased activity tolerance;Decreased balance;Decreased mobility;Decreased knowledge of precautions;Pain       PT Treatment Interventions DME instruction;Gait training;Stair training;Functional mobility training;Therapeutic activities;Therapeutic exercise;Balance training;Neuromuscular re-education;Patient/family education;Manual techniques    PT Goals (Current goals can be found in the Care Plan section)  Acute Rehab PT Goals Patient Stated Goal: Return to prior function  PT Goal Formulation: With patient Time For Goal Achievement: 12/05/16 Potential to Achieve Goals: Fair    Frequency BID   Barriers to discharge Decreased caregiver support Lives alone    Co-evaluation               AM-PAC PT "6 Clicks" Daily Activity  Outcome Measure Difficulty turning over in bed (including adjusting bedclothes, sheets and blankets)?: Unable Difficulty moving from lying on back to sitting on the side of the bed? : Unable Difficulty sitting down on and standing up from a chair with arms (e.g., wheelchair, bedside commode, etc,.)?: Unable Help needed moving to and from a bed to chair (including a wheelchair)?: A Lot Help needed walking in hospital room?: A Lot Help needed climbing 3-5 steps with a railing? : Total 6 Click Score: 8    End of Session Equipment Utilized During Treatment: Gait belt Activity Tolerance: Patient tolerated treatment well Patient left: in chair;with call bell/phone within reach;with  chair alarm set Nurse Communication: Mobility status PT Visit Diagnosis: Unsteadiness on feet (R26.81);Other abnormalities of gait and mobility (R26.89);History of falling  (Z91.81);Muscle weakness (generalized) (M62.81);Pain Pain - Right/Left: Left Pain - part of body: Hip    Time: 1155-2080 PT Time Calculation (min) (ACUTE ONLY): 34 min   Charges:   PT Evaluation $PT Eval Low Complexity: 1 Low PT Treatments $Therapeutic Exercise: 8-22 mins   PT G Codes:   PT G-Codes **NOT FOR INPATIENT CLASS** Functional Assessment Tool Used: AM-PAC 6 Clicks Basic Mobility Functional Limitation: Mobility: Walking and moving around Mobility: Walking and Moving Around Current Status (E2336): At least 80 percent but less than 100 percent impaired, limited or restricted Mobility: Walking and Moving Around Goal Status [phone]): At least 20 percent but less than 40 percent impaired, limited or restricted    Phillips Grout PT, DPT    Deandrea Vanpelt 11/21/2016, 11:23 AM

## 2016-11-21 NOTE — Progress Notes (Signed)
[facility] SNF authorization has been received, auth # [id number]. [facility] liaison is aware of above. Clinical Social Worker (CSW) contacted patient's daughter Helene Kelp and made her aware of above. CSW will continue to follow and assist as needed.   [employer], LCSW [phone]

## 2016-11-21 NOTE — Progress Notes (Signed)
PT is recommending SNF. Clinical Social Worker (CSW) met with patient's daughter Teresa at bedside and presented SNF bed offers. Daughter and patient chose [facility]. [facility] liaison is aware of above. CSW left a voicemail for [facility] case manager making her aware of above.   Bailey Sample, LCSW [phone]  

## 2016-11-21 NOTE — Anesthesia Postprocedure Evaluation (Signed)
Anesthesia Post Note  Patient: Billy Choi  Procedure(s) Performed: Procedure(s) (LRB): ARTHROPLASTY BIPOLAR HIP (HEMIARTHROPLASTY) (Left)  Patient location during evaluation: Nursing Unit Anesthesia Type: Spinal Level of consciousness: awake, awake and alert and oriented Pain management: pain level controlled Vital Signs Assessment: post-procedure vital signs reviewed and stable Respiratory status: spontaneous breathing Cardiovascular status: blood pressure returned to baseline Postop Assessment: no headache, no backache, no apparent nausea or vomiting and adequate PO intake Anesthetic complications: no     Last Vitals:  Vitals:   11/21/16 0224 11/21/16 0302  BP:  (!) 125/58  Pulse:  82  Resp:  18  Temp:  36.6 C  SpO2: 98% 98%    Last Pain:  Vitals:   11/21/16 0441  TempSrc:   PainSc: 8                  Giovanny Dugal

## 2016-11-21 NOTE — Progress Notes (Signed)
[facility] NAME: Billy Choi    MR#:  [id number]  DATE OF BIRTH:  1921/07/18  SUBJECTIVE:  CHIEF COMPLAINT:  patient is out of bed to chair.no bowel movement yet, hurting really bad when working with physical therapy  REVIEW OF SYSTEMS:  CONSTITUTIONAL: No fever, fatigue or weakness.  EYES: No blurred or double vision.  EARS, NOSE, AND THROAT: No tinnitus or ear pain.  RESPIRATORY: No cough, shortness of breath, wheezing or hemoptysis.  CARDIOVASCULAR: No chest pain, orthopnea, edema.  GASTROINTESTINAL: No nausea, vomiting, diarrhea or abdominal pain.  GENITOURINARY: No dysuria, hematuria.  ENDOCRINE: No polyuria, nocturia,  HEMATOLOGY: No anemia, easy bruising or bleeding SKIN: No rash or lesion. MUSCULOSKELETAL: left hip pain NEUROLOGIC: No tingling, numbness, weakness.  PSYCHIATRY: No anxiety or depression.   DRUG ALLERGIES:   Allergies  Allergen Reactions  . Lipitor [Atorvastatin] Rash    VITALS:  Blood pressure 119/65, pulse 85, temperature 97.8 F (36.6 C), temperature source Oral, resp. rate 16, height 5\' 7"  (1.702 m), weight 63.5 kg (140 lb), SpO2 96 %.  PHYSICAL EXAMINATION:  GENERAL:  83 y.o.-year-old patient lying in the bed with no acute distress.  EYES: Pupils equal, round, reactive to light and accommodation. No scleral icterus. Extraocular muscles intact.  HEENT: Head atraumatic, normocephalic. Oropharynx and nasopharynx clear.  NECK:  Supple, no jugular venous distention. No thyroid enlargement, no tenderness.  LUNGS: Normal breath sounds bilaterally, no wheezing, rales,rhonchi or crepitation. No use of accessory muscles of respiration.  CARDIOVASCULAR: S1, S2 normal. No murmurs, rubs, or gallops.  ABDOMEN: Soft, nontender, nondistended. Bowel sounds present. No organomegaly or mass.  EXTREMITIES:Left hip with honeycomb dressing  No pedal edema, cyanosis, or clubbing.  NEUROLOGIC: Cranial nerves  II through XII are intact. Muscle strength 5/5 in all extremities. Sensation intact. Gait not checked.  PSYCHIATRIC: The patient is alert and oriented x 3.  SKIN: No obvious rash, lesion, or ulcer.    LABORATORY PANEL:   CBC  Recent Labs Lab 11/21/16 0504  WBC 15.7*  HGB 11.7*  HCT 34.5*  PLT 186   ------------------------------------------------------------------------------------------------------------------  Chemistries   Recent Labs Lab 11/19/16 1656  11/21/16 0504  NA 138  < > 139  K 4.3  < > 4.2  CL 99*  < > 109  CO2 29  < > 26  GLUCOSE 102*  < > 153*  BUN 22*  < > 31*  CREATININE 0.94  < > 1.01  CALCIUM 9.3  < > 8.2*  AST 28  --   --   ALT 17  --   --   ALKPHOS 55  --   --   BILITOT 2.2*  --   --   < > = values in this interval not displayed. ------------------------------------------------------------------------------------------------------------------  Cardiac Enzymes  Recent Labs Lab 11/19/16 1656  TROPONINI <0.03   ------------------------------------------------------------------------------------------------------------------  RADIOLOGY:  Dg Chest 1 View  Result Date: 11/19/2016 CLINICAL DATA:  Initial evaluation for acute hypoxia, concern for pneumonia. EXAM: CHEST 1 VIEW COMPARISON:  Prior radiograph from 02/13/2008. FINDINGS: Median sternotomy wires underlying CABG markers. Mild cardiomegaly. Mediastinal silhouette normal. Aortic atherosclerosis. Lungs hypoinflated. Patchy and linear bibasilar opacities, which may reflect atelectasis or infiltrates. Changes greater on the left. Underlying COPD. No pulmonary edema or pleural effusion. No pneumothorax. No acute osseus abnormality. IMPRESSION: 1. Shallow lung inflation with patchy and linear bibasilar opacities, left greater than right. Findings may reflect atelectasis or possibly infiltrates. 2. Underlying  COPD. 3. Aortic atherosclerosis. Electronically Signed   By: Jeannine Boga M.D.   On:  11/19/2016 17:06   Ct Head Wo Contrast  Result Date: 11/19/2016 CLINICAL DATA:  83 year old male with head trauma following fall today. Initial encounter. EXAM: CT HEAD WITHOUT CONTRAST TECHNIQUE: Contiguous axial images were obtained from the base of the skull through the vertex without intravenous contrast. COMPARISON:  None. FINDINGS: Brain: No evidence of acute infarction, hemorrhage, hydrocephalus, extra-axial collection or mass lesion/mass effect. Atrophy and mild chronic small-vessel white matter ischemic changes noted. Vascular: Intracranial atherosclerotic calcifications noted. Skull: Normal. Negative for fracture or focal lesion. Sinuses/Orbits: No acute finding. Other: None. IMPRESSION: 1. No evidence of acute intracranial abnormality 2. Atrophy and mild chronic small-vessel white matter ischemic changes. Electronically Signed   By: Margarette Canada M.D.   On: 11/19/2016 16:32   Dg Hip Operative Unilat W Or W/o Pelvis Left  Result Date: 11/20/2016 CLINICAL DATA:  Left hip arthroplasty EXAM: OPERATIVE left HIP (WITH PELVIS IF PERFORMED) 1 VIEWS TECHNIQUE: Fluoroscopic spot image(s) were submitted for interpretation post-operatively. COMPARISON:  11/19/2016 FINDINGS: Total fluoroscopy time was 10 seconds. A single low resolution intraoperative view of the left hip shows no orthopedic hardware. No dislocation evident. IMPRESSION: Intraoperative fluoroscopic image of the left hip. Electronically Signed   By: Donavan Foil M.D.   On: 11/20/2016 18:46   Dg Hip Unilat W Or W/o Pelvis 2-3 Views Left  Result Date: 11/20/2016 CLINICAL DATA:  Initial evaluation status post hip replacement. EXAM: DG HIP (WITH OR WITHOUT PELVIS) 2-3V LEFT COMPARISON:  Prior radiograph from 11/19/2016. FINDINGS: Postoperative changes from interval left total hip arthroplasty. Prosthetic and femoral components appear well seated. No periprosthetic fracture or other complication. Associated postoperative soft tissue swelling and  emphysema overlies the hip. Skin staples in place. IMPRESSION: Postoperative changes from interval left total hip arthroplasty without complication. Electronically Signed   By: Jeannine Boga M.D.   On: 11/20/2016 19:23   Dg Hip Unilat With Pelvis 2-3 Views Left  Result Date: 11/19/2016 CLINICAL DATA:  Initial evaluation for acute trauma, fall. EXAM: DG HIP (WITH OR WITHOUT PELVIS) 2-3V LEFT COMPARISON:  None. FINDINGS: Linear lucency with cortical irregularity extending through the left femoral neck, suspicious for acute subcapital fracture. No significant displacement. Femoral head remains normally position within the acetabulum. Femoral head height preserved. No acute osseus abnormality about the visualized bony pelvis and right hip. Osteopenia. Prominent degenerative changes noted within lower lumbar spine with sequelae of prior vertebral augmentation. No acute soft tissue abnormality. Prominent vascular calcifications. IMPRESSION: Linear lucency with cortical regularity extending through the left femoral neck, suspicious for acute nondisplaced subcapital fracture. Electronically Signed   By: Jeannine Boga M.D.   On: 11/19/2016 17:05    EKG:   Orders placed or performed during the hospital encounter of 11/19/16  . ED EKG  . ED EKG  . EKG 12-Lead  . EKG 12-Lead    ASSESSMENT AND PLAN:   Bernhardt Riemenschneider  is a 83 y.o. male with a known history of chronic atrial fibrillation, hypertension, hyperlipidemia is brought into the ED after he sustained a fall. Patient was short of breath x-ray has revealed left greater than right infiltrates and also noticed wheezing. Patient is started on IV antibiotics and Solu-Medrol for bronchoconstriction. X-ray has revealed left hip fracture.   # Community acquired pneumonia with bronchoconstriction Clinically better today  Sputum culture and sensitivity pending Rocephin and azithromycin Solu-Medrol and neb treatments will be continued  #Chronic  history of atrial fibrillation rate controlled Aspirin will be on hold . Cardiology will address need for long-term oral anticoagulation postoperatively (CHA2DS2VASc equals 4). Cardiology [facility] consulted for cardiac clearance optimized with moderate risk for perioperative cardiac or complications from cardiac standpoint Recommending baby aspirin for now  #essential hypertension Continue home medication lisinopril  #Fall and left hip fracture Postop day #1 Pain management as needed Ortho - dr.Menz is aware Status post left hip hemiarthroplasty Working with physical therapy No bowel movement yet Discontinue Foley catheter today    All the records are reviewed and case discussed with Care Management/Social Workerr. Management plans discussed with the patient, family and they are in agreement.  CODE STATUS: dnr   TOTAL TIME TAKING CARE OF THIS PATIENT: 36 minutes.   POSSIBLE D/C IN 2 DAYS, DEPENDING ON CLINICAL CONDITION.  Note: This dictation was prepared with Dragon dictation along with smaller phrase technology. Any transcriptional errors that result from this process are unintentional.   Nicholes Mango M.D on 11/21/2016 at 3:03 PM  Between 7am to 6pm - Pager - [phone] After 6pm go to [email or web address] - password [email or web address] [facility]  [facility]  Office  [phone]  CC: Primary care physician; Katheren Shams

## 2016-11-21 NOTE — Plan of Care (Signed)
Problem: Safety: Goal: Ability to remain free from injury will improve Outcome: Progressing Patient remained free from injury thus far this shift, bed alarm on, side rails up times two, call bell and bedside table within reach. Patient out of bed to chair today with PT.   Problem: Pain Managment: Goal: General experience of comfort will improve Outcome: Progressing Patient denied any pain this shift only complaining of soreness. Patient refused pain medication including the scheduled Tylenol; states that he is fine and will ask for pain medication when needed. Patient is not eating much, but has not had a BM since this past Sunday per patient report. Patient given schedule AM docusate and given PRN Miralax this afternoon for complaint of mild constipation. Patient encouraged to eat and drink.  Problem: Skin Integrity: Goal: Risk for impaired skin integrity will decrease Outcome: Progressing Patient turned and repositioned as tolerated

## 2016-11-21 NOTE — Progress Notes (Addendum)
   Subjective: 1 Day Post-Op Procedure(s) (LRB): ARTHROPLASTY BIPOLAR HIP (HEMIARTHROPLASTY) (Left) Patient reports pain as moderate.   Patient is well, and has had no acute complaints or problems Denies any CP, SOB, ABD pain. We will start therapy today.  Plan is to go Skilled nursing facility after hospital stay.  Objective: Vital signs in last 24 hours: Temp:  [97 F (36.1 C)-97.9 F (36.6 C)] 97.8 F (36.6 C) (09/26 0302) Pulse Rate:  [55-85] 82 (09/26 0302) Resp:  [13-23] 18 (09/26 0302) BP: (118-148)/(51-80) 125/58 (09/26 0302) SpO2:  [95 %-100 %] 98 % (09/26 0302)  Intake/Output from previous day: 09/25 0701 - 09/26 0700 In: 1870.3 [I.V.:1870.3] Out: 1050 [Urine:600; Blood:450] Intake/Output this shift: No intake/output data recorded.   Recent Labs  11/19/16 1656 11/20/16 0429 11/21/16 0504  HGB 15.2 14.5 11.7*    Recent Labs  11/20/16 0429 11/21/16 0504  WBC 6.7 15.7*  RBC 4.52 3.78*  HCT 42.1 34.5*  PLT 158 186    Recent Labs  11/20/16 0429 11/21/16 0504  NA 141 139  K 3.9 4.2  CL 108 109  CO2 27 26  BUN 23* 31*  CREATININE 0.84 1.01  GLUCOSE 190* 153*  CALCIUM 8.6* 8.2*   No results for input(s): LABPT, INR in the last 72 hours.  EXAM General - Patient is Alert, Appropriate and Oriented Extremity - Sensation intact distally Intact pulses distally Dorsiflexion/Plantar flexion intact No cellulitis present Compartment soft Dressing - scant drainage Motor Function - intact, moving foot and toes well on exam.   Past Medical History:  Diagnosis Date  . Atrial fibrillation ([place])    a. Seen on ecg 07/2015 - pt unaware of history and is not on [place]. Per outpt clinic note, pt refused eval. CHA2DS2VASc = 4.  . Basal cell carcinoma   . CAD (coronary artery disease)    a. 1999 s/p CABG @ [facility] - presumably x 3 (2 VG markers on CXR, presume LIMA used).  . COPD (chronic obstructive pulmonary disease) ([place])   . Hyperlipidemia   . Hypertension      Assessment/Plan:   1 Day Post-Op Procedure(s) (LRB): ARTHROPLASTY BIPOLAR HIP (HEMIARTHROPLASTY) (Left) Active Problems:   COPD with acute exacerbation (HCC)  Estimated body mass index is 21.93 kg/m as calculated from the following:   Height as of this encounter: 5\' 7"  (1.702 m).   Weight as of this encounter: 63.5 kg (140 lb). Advance diet Up with therapy  Needs bowel movement Acute post op blood loss anemia - hemoglobin stable at 11.7.  Recheck labs in the morning.  DVT Prophylaxis - Lovenox, Foot Pumps and TED hose Weight-Bearing as tolerated to left leg   T. Rachelle Hora, PA-C [place] 11/21/2016, 8:20 AM

## 2016-11-21 NOTE — Progress Notes (Signed)
Spoke with MD, Gouru regarding pt's vitals- MD stated to give only 20mg  of Lisinopril this morning.

## 2016-11-22 ENCOUNTER — Telehealth: Payer: Self-pay | Admitting: Internal Medicine

## 2016-11-22 DIAGNOSIS — I4891 Unspecified atrial fibrillation: Secondary | ICD-10-CM | POA: Diagnosis not present

## 2016-11-22 DIAGNOSIS — I2510 Atherosclerotic heart disease of native coronary artery without angina pectoris: Secondary | ICD-10-CM | POA: Diagnosis not present

## 2016-11-22 DIAGNOSIS — J449 Chronic obstructive pulmonary disease, unspecified: Secondary | ICD-10-CM | POA: Diagnosis not present

## 2016-11-22 DIAGNOSIS — S72002D Fracture of unspecified part of neck of left femur, subsequent encounter for closed fracture with routine healing: Secondary | ICD-10-CM | POA: Diagnosis not present

## 2016-11-22 DIAGNOSIS — K5900 Constipation, unspecified: Secondary | ICD-10-CM | POA: Diagnosis not present

## 2016-11-22 DIAGNOSIS — S72009A Fracture of unspecified part of neck of unspecified femur, initial encounter for closed fracture: Secondary | ICD-10-CM | POA: Diagnosis not present

## 2016-11-22 DIAGNOSIS — I482 Chronic atrial fibrillation: Secondary | ICD-10-CM | POA: Diagnosis not present

## 2016-11-22 DIAGNOSIS — M6281 Muscle weakness (generalized): Secondary | ICD-10-CM | POA: Diagnosis not present

## 2016-11-22 DIAGNOSIS — R41841 Cognitive communication deficit: Secondary | ICD-10-CM | POA: Diagnosis not present

## 2016-11-22 DIAGNOSIS — S7292XD Unspecified fracture of left femur, subsequent encounter for closed fracture with routine healing: Secondary | ICD-10-CM | POA: Diagnosis not present

## 2016-11-22 DIAGNOSIS — I10 Essential (primary) hypertension: Secondary | ICD-10-CM | POA: Diagnosis not present

## 2016-11-22 DIAGNOSIS — J189 Pneumonia, unspecified organism: Secondary | ICD-10-CM | POA: Diagnosis not present

## 2016-11-22 DIAGNOSIS — D649 Anemia, unspecified: Secondary | ICD-10-CM | POA: Diagnosis not present

## 2016-11-22 DIAGNOSIS — J159 Unspecified bacterial pneumonia: Secondary | ICD-10-CM | POA: Diagnosis not present

## 2016-11-22 DIAGNOSIS — Z781 Physical restraint status: Secondary | ICD-10-CM | POA: Diagnosis not present

## 2016-11-22 DIAGNOSIS — R1312 Dysphagia, oropharyngeal phase: Secondary | ICD-10-CM | POA: Diagnosis not present

## 2016-11-22 DIAGNOSIS — M542 Cervicalgia: Secondary | ICD-10-CM | POA: Diagnosis not present

## 2016-11-22 DIAGNOSIS — K219 Gastro-esophageal reflux disease without esophagitis: Secondary | ICD-10-CM | POA: Diagnosis not present

## 2016-11-22 DIAGNOSIS — E785 Hyperlipidemia, unspecified: Secondary | ICD-10-CM | POA: Diagnosis not present

## 2016-11-22 LAB — BASIC METABOLIC PANEL
Anion gap: 4 — ABNORMAL LOW (ref 5–15)
BUN: 38 mg/dL — AB (ref 6–20)
CALCIUM: 8.4 mg/dL — AB (ref 8.9–10.3)
CO2: 28 mmol/L (ref 22–32)
CREATININE: 0.85 mg/dL (ref 0.61–1.24)
Chloride: 107 mmol/L (ref 101–111)
GFR calc non Af Amer: >60 mL/min (ref >60)
Glucose, Bld: 127 mg/dL — ABNORMAL HIGH (ref 65–99)
Potassium: 4.1 mmol/L (ref 3.5–5.1)
SODIUM: 139 mmol/L (ref 135–145)

## 2016-11-22 LAB — CBC
HCT: 29.6 % — ABNORMAL LOW (ref 40.0–52.0)
Hemoglobin: 10.3 g/dL — ABNORMAL LOW (ref 13.0–18.0)
MCH: 32 pg (ref 26.0–34.0)
MCHC: 34.7 g/dL (ref 32.0–36.0)
MCV: 92 fL (ref 80.0–100.0)
Platelets: 154 10*3/uL (ref 150–440)
RBC: 3.22 MIL/uL — ABNORMAL LOW (ref 4.40–5.90)
RDW: 13.5 % (ref 11.5–14.5)
WBC: 10 10*3/uL (ref 3.8–10.6)

## 2016-11-22 LAB — EXPECTORATED SPUTUM ASSESSMENT W GRAM STAIN, RFLX TO RESP C

## 2016-11-22 LAB — SURGICAL PATHOLOGY

## 2016-11-22 LAB — EXPECTORATED SPUTUM ASSESSMENT W REFEX TO RESP CULTURE

## 2016-11-22 MED ORDER — PANTOPRAZOLE SODIUM 40 MG PO TBEC: 40.0000 mg | DELAYED_RELEASE_TABLET | Freq: Every day | ORAL | Status: AC

## 2016-11-22 MED ORDER — AMOXICILLIN-POT CLAVULANATE 875-125 MG PO TABS: 1.0000 | ORAL_TABLET | Freq: Two times a day (BID) | ORAL | 0 refills | Status: AC

## 2016-11-22 MED ORDER — ENSURE ENLIVE PO LIQD: 237.0000 mL | Freq: Two times a day (BID) | ORAL | 12 refills | Status: AC

## 2016-11-22 MED ORDER — ASPIRIN EC 81 MG PO TBEC: 81.0000 mg | DELAYED_RELEASE_TABLET | Freq: Every day | ORAL | 2 refills | Status: AC

## 2016-11-22 MED ORDER — PREDNISONE 10 MG (21) PO TBPK: ORAL_TABLET | Freq: Every day | ORAL | 0 refills | Status: AC

## 2016-11-22 MED ORDER — ALBUTEROL SULFATE (2.5 MG/3ML) 0.083% IN NEBU: 2.5000 mg | INHALATION_SOLUTION | RESPIRATORY_TRACT | 12 refills | Status: AC | PRN

## 2016-11-22 MED ORDER — ACETAMINOPHEN 325 MG PO TABS: 650.0000 mg | ORAL_TABLET | Freq: Four times a day (QID) | ORAL | Status: AC | PRN

## 2016-11-22 MED ORDER — ENOXAPARIN SODIUM 40 MG/0.4ML ~~LOC~~ SOLN: 40.0000 mg | SUBCUTANEOUS | 0 refills | Status: AC

## 2016-11-22 MED ORDER — SODIUM CHLORIDE 0.9 % IV BOLUS (SEPSIS)
1000.0000 mL | INTRAVENOUS | Status: DC | PRN
  Administered 2016-11-22: 1000 mL via INTRAVENOUS

## 2016-11-22 MED ORDER — POLYETHYLENE GLYCOL 3350 17 G PO PACK: 17.0000 g | PACK | Freq: Every day | ORAL | 0 refills | Status: AC | PRN

## 2016-11-22 MED ORDER — TRAMADOL HCL 50 MG PO TABS: 50.0000 mg | ORAL_TABLET | Freq: Four times a day (QID) | ORAL | 0 refills | Status: AC | PRN

## 2016-11-22 MED ORDER — ALUM & MAG HYDROXIDE-SIMETH 200-200-20 MG/5ML PO SUSP: 30.0000 mL | ORAL | 0 refills | Status: AC | PRN

## 2016-11-22 MED ORDER — DOCUSATE SODIUM 100 MG PO CAPS: 100.0000 mg | ORAL_CAPSULE | Freq: Every day | ORAL | 0 refills | Status: AC

## 2016-11-22 MED ORDER — ENSURE ENLIVE PO LIQD
237.0000 mL | Freq: Two times a day (BID) | ORAL | Status: DC
  Administered 2016-11-22: 237 mL via ORAL

## 2016-11-22 NOTE — Progress Notes (Signed)
Physical Therapy Treatment Patient Details Name: Billy Choi MRN: [id number] DOB: [birth year] Today's Date: 11/22/2016    History of Present Illness Billy Choi is a 83 y.o. male with a known history of chronic atrial fibrillation, hypertension, hyperlipidemia is brought into the ED after he sustained a fall. Patient was short of breath x-ray has revealed left greater than right infiltrates and also noticed wheezing. Patient is started on IV antibiotics and Solu-Medrol for bronchoconstriction. X-ray has revealed left hip fracture. He is now POD#1 s/p L hemiarthroplasty.     PT Comments    Pt demonstrates significant improvement with therapy today. He continues to require modA+1 for bed mobility and minA+1 for transfers but significant improvement in speed and sequencing for both today. He demonstrates significant improvement in ambulation today with therapy and is able to go 72' with chair follow. He continues to require minA+1 for occasional instability and sequencing with walker but is much more steady. Pt reports increase in soreness in L hip with ambulation but continues to deny pain. SaO2 95% following ambulation and pt denies DOE. He is able to complete all seated exercises as instructed with ability to perform seated L hip flexion without assist from therapist. Pt will benefit from PT services to address deficits in strength, balance, and mobility in order to return to full function at home.    Follow Up Recommendations  SNF     Equipment Recommendations  Other (comment) (TBD at next venue)    Recommendations for Other Services       Precautions / Restrictions Precautions Precautions: Anterior Hip;Fall Precaution Booklet Issued: Yes (comment) Restrictions Weight Bearing Restrictions: Yes LLE Weight Bearing: Weight bearing as tolerated    Mobility  Bed Mobility Overal bed mobility: Needs Assistance Bed Mobility: Supine to Sit     Supine to sit: Mod assist     General bed  mobility comments: Pt requires assist for LLE and trunk when going from supine to sitting. HOB elevated and bed rails utilized  Transfers Overall transfer level: Needs assistance Equipment used: Rolling walker (2 wheeled) Transfers: Sit to/from Stand Sit to Stand: Min assist         General transfer comment: Continues to require cues for safe hand placement and sequencing with transfer. Improved weight shift to LLE but remains decreased. Improved speed today but still requires increased time to come to standing. Performed transfer to [facility] with patient. Cues required for safe hand placement during both phase of toilet transfer  Ambulation/Gait Ambulation/Gait assistance: Mod assist Ambulation Distance (Feet): 80 Feet Assistive device: Rolling walker (2 wheeled) Gait Pattern/deviations: Decreased step length - right;Decreased step length - left;Decreased weight shift to left Gait velocity: Decreased Gait velocity interpretation: <1.8 ft/sec, indicative of risk for recurrent falls General Gait Details: Pt demonstrates significant improvement in ambulation today with therapy. He continues to require minA+1 for occasional instability and sequencing with walker but is much more steady. Pt reports increase in soreness in L hip with ambulation but continues to deny pain. SaO2 95% following ambulation and pt denies DOE. Chair follow for safety during ambulation   Stairs            Wheelchair Mobility    Modified Rankin (Stroke Patients Only)       Balance Overall balance assessment: Needs assistance Sitting-balance support: No upper extremity supported Sitting balance-Leahy Scale: Fair     Standing balance support: No upper extremity supported Standing balance-Leahy Scale: Poor  Cognition Arousal/Alertness: Awake/alert Behavior During Therapy: WFL for tasks assessed/performed Overall Cognitive Status: Within Functional Limits for tasks  assessed                                 General Comments: AOx3 today with partial orientation to situation      Exercises Total Joint Exercises Ankle Circles/Pumps: Strengthening;Left;15 reps;Seated Hip ABduction/ADduction: Strengthening;Both;Seated;15 reps Long Arc Quad: Strengthening;Left;Seated;15 reps Knee Flexion: Strengthening;Left;Seated;15 reps Marching in Standing: Strengthening;Both;Seated;15 reps    General Comments        Pertinent Vitals/Pain Pain Assessment: No/denies pain Pain Location: Denies L hip pain but reports, "I'm as sore as the dickens"    Home Living                      Prior Function            PT Goals (current goals can now be found in the care plan section) Acute Rehab PT Goals Patient Stated Goal: Return to prior function  PT Goal Formulation: With patient Time For Goal Achievement: 12/05/16 Potential to Achieve Goals: Fair Progress towards PT goals: Progressing toward goals    Frequency    BID      PT Plan Current plan remains appropriate    Co-evaluation              AM-PAC PT "6 Clicks" Daily Activity  Outcome Measure  Difficulty turning over in bed (including adjusting bedclothes, sheets and blankets)?: Unable Difficulty moving from lying on back to sitting on the side of the bed? : Unable Difficulty sitting down on and standing up from a chair with arms (e.g., wheelchair, bedside commode, etc,.)?: Unable Help needed moving to and from a bed to chair (including a wheelchair)?: A Lot Help needed walking in hospital room?: A Lot Help needed climbing 3-5 steps with a railing? : Total 6 Click Score: 8    End of Session Equipment Utilized During Treatment: Gait belt Activity Tolerance: Patient tolerated treatment well Patient left: with call bell/phone within reach;with SCD's reapplied;in chair;with chair alarm set;Other (comment) (pillow under LLE)   PT Visit Diagnosis: Unsteadiness on feet  (R26.81);Other abnormalities of gait and mobility (R26.89);History of falling (Z91.81);Muscle weakness (generalized) (M62.81);Pain Pain - Right/Left: Left Pain - part of body: Hip     Time: 0817-0850 PT Time Calculation (min) (ACUTE ONLY): 33 min  Charges:  $Gait Training: 8-22 mins $Therapeutic Exercise: 8-22 mins                    G Codes:       Lyndel Safe Choi PT, DPT     Choi,Billy 11/22/2016, 9:05 AM

## 2016-11-22 NOTE — Discharge Summary (Signed)
[facility] NAME: Billy Choi    MR#:  [id number]  DATE OF BIRTH:  Jun 30, 1921  DATE OF ADMISSION:  11/19/2016 ADMITTING PHYSICIAN: Nicholes Mango, MD  DATE OF DISCHARGE:  11/22/16  PRIMARY CARE PHYSICIAN: [facility]    ADMISSION DIAGNOSIS:  Syncope and collapse [R55] Fall [W19.XXXA] Acute respiratory failure with hypoxia (HCC) [J96.01] Closed fracture of neck of left femur, initial encounter ([place]) [S72.002A]  DISCHARGE DIAGNOSIS:  Active Problems:   COPD with acute exacerbation ([place])   SECONDARY DIAGNOSIS:   Past Medical History:  Diagnosis Date  . Atrial fibrillation ([place])    a. Seen on ecg 07/2015 - pt unaware of history and is not on [place]. Per outpt clinic note, pt refused eval. CHA2DS2VASc = 4.  . Basal cell carcinoma   . CAD (coronary artery disease)    a. 1999 s/p CABG @ [facility] - presumably x 3 (2 VG markers on CXR, presume LIMA used).  . COPD (chronic obstructive pulmonary disease) ([place])   . Hyperlipidemia   . Hypertension     HOSPITAL COURSE:   HPI  Billy Choi  is a 83 y.o. male with a known history of chronic atrial fibrillation, hypertension, hyperlipidemia is brought into the ED after he sustained a fall. Patient was short of breath x-ray has revealed left greater than right infiltrates and also noticed wheezing. Patient is started on IV antibiotics and Solu-Medrol for bronchoconstriction. X-ray has revealed left hip fracture. Patient is resting comfortably during my examination  # Community acquired pneumonia with bronchoconstriction Clinically better today  Sputum culture and sensitivity ordered , but sample not collected. Rocephin and azithromycin given , pt clinically improved, will d/c pt with augmentin, steroids   neb treatments will be continued prn  #Chronic history of atrial fibrillation rate controlled  Cardiology will address need for long-term oral anticoagulation (CHA2DS2VASc equals  4) outpatient  Cardiology [facility] consulted for cardiac clearance optimized with moderate risk for perioperative cardiac or complications from cardiac standpoint Recommending baby aspirin for now  #essential hypertension Continue home medication lisinopril  #Fall and left hip fracture Postop day #2 Pain management as needed Ortho - dr.Menz is aware Status post left hip hemiarthroplasty Working with physical therapy, recommending skilled nursing facility Had a bowel movement today  Discharge with Lovenox subcutaneous for 14 days  and to follow-up in 2 weeks Discontinue Foley catheter today    DISCHARGE CONDITIONS:   Stable  CONSULTS OBTAINED:  Treatment Team:  Hessie Knows, MD Wellington Hampshire, MD   PROCEDURES Left hip hemiarthroplasty  DRUG ALLERGIES:   Allergies  Allergen Reactions  . Lipitor [Atorvastatin] Rash    DISCHARGE MEDICATIONS:   Current Discharge Medication List    START taking these medications   Details  acetaminophen (TYLENOL) 325 MG tablet Take 2 tablets (650 mg total) by mouth every 6 (six) hours as needed for mild pain (or Fever >/= 101).    albuterol (PROVENTIL) (2.5 MG/3ML) 0.083% nebulizer solution Take 3 mLs (2.5 mg total) by nebulization every 4 (four) hours as needed for wheezing or shortness of breath. Qty: 75 mL, Refills: 12    alum & mag hydroxide-simeth (MAALOX/MYLANTA) 200-200-20 MG/5ML suspension Take 30 mLs by mouth every 4 (four) hours as needed for indigestion. Qty: 355 mL, Refills: 0    amoxicillin-clavulanate (AUGMENTIN) 875-125 MG tablet Take 1 tablet by mouth 2 (two) times daily. Qty: 10 tablet, Refills: 0    aspirin EC 81 MG tablet  Take 1 tablet (81 mg total) by mouth daily. Qty: 150 tablet, Refills: 2    docusate sodium (COLACE) 100 MG capsule Take 1 capsule (100 mg total) by mouth daily. Qty: 10 capsule, Refills: 0    enoxaparin (LOVENOX) 40 MG/0.4ML injection Inject 0.4 mLs (40 mg total) into the skin daily. Qty:  14 Syringe, Refills: 0    feeding supplement, ENSURE ENLIVE, (ENSURE ENLIVE) LIQD Take 237 mLs by mouth 2 (two) times daily between meals. Qty: 237 mL, Refills: 12    pantoprazole (PROTONIX) 40 MG tablet Take 1 tablet (40 mg total) by mouth daily.    polyethylene glycol (MIRALAX / GLYCOLAX) packet Take 17 g by mouth daily as needed for mild constipation. Qty: 14 each, Refills: 0    predniSONE (STERAPRED UNI-PAK 21 TAB) 10 MG (21) TBPK tablet Take by mouth daily. Take 6 tablets by mouth for 1 day followed by  5 tablets by mouth for 1 day followed by  4 tablets by mouth for 1 day followed by  3 tablets by mouth for 1 day followed by  2 tablets by mouth for 1 day followed by  1 tablet by mouth for a day and stop Qty: 21 tablet, Refills: 0    traMADol (ULTRAM) 50 MG tablet Take 1 tablet (50 mg total) by mouth every 6 (six) hours as needed for moderate pain. Qty: 30 tablet, Refills: 0      CONTINUE these medications which have NOT CHANGED   Details  cholecalciferol (VITAMIN D) 1000 units tablet Take 1,000 Units by mouth daily.    lisinopril (PRINIVIL,ZESTRIL) 40 MG tablet Take 40 mg by mouth daily.    Multiple Vitamins-Minerals (EQ COMPLETE MULTIVIT ADULT 50+) TABS Take 1 tablet by mouth daily.    simvastatin (ZOCOR) 80 MG tablet Take 40 mg by mouth at bedtime.     vitamin B-12 (CYANOCOBALAMIN) 1000 MCG tablet Take 1,000 mcg by mouth daily.      STOP taking these medications     aspirin 325 MG tablet          DISCHARGE INSTRUCTIONS:   Follow-up with [facility] in 5-7 days Follow-up with orthopedics in 2 weeks Follow-up with cardiology dr.end in 2 weeks DIET:  Cardiac diet  DISCHARGE CONDITION:  Stable  ACTIVITY:  Activity as tolerated per ortho  OXYGEN:  Home Oxygen: No.   Oxygen Delivery: room air  DISCHARGE LOCATION:  nursing home   If you experience worsening of your admission symptoms, develop shortness of breath, life  threatening emergency, suicidal or homicidal thoughts you must seek medical attention immediately by calling 911 or calling your MD immediately  if symptoms less severe.  You Must read complete instructions/literature along with all the possible adverse reactions/side effects for all the Medicines you take and that have been prescribed to you. Take any new Medicines after you have completely understood and accpet all the possible adverse reactions/side effects.   Please note  You were cared for by a hospitalist during your hospital stay. If you have any questions about your discharge medications or the care you received while you were in the hospital after you are discharged, you can call the unit and asked to speak with the hospitalist on call if the hospitalist that took care of you is not available. Once you are discharged, your primary care physician will handle any further medical issues. Please note that NO REFILLS for any discharge medications will be authorized once you are discharged,  as it is imperative that you return to your primary care physician (or establish a relationship with a primary care physician if you do not have one) for your aftercare needs so that they can reassess your need for medications and monitor your lab values.     Today  Chief Complaint  Patient presents with  . Fall   Patient is out of bed to chair working with physical therapy, had a bowel movement for the catheter removed Pain  well controlled  ROS:  CONSTITUTIONAL: Denies fevers, chills. Denies any fatigue, weakness.  EYES: Denies blurry vision, double vision, eye pain. EARS, NOSE, THROAT: Denies tinnitus, ear pain, hearing loss. RESPIRATORY: Denies cough, wheeze, shortness of breath.  CARDIOVASCULAR: Denies chest pain, palpitations, edema.  GASTROINTESTINAL: Denies nausea, vomiting, diarrhea, abdominal pain. Denies bright red blood per rectum. GENITOURINARY: Denies dysuria, hematuria. ENDOCRINE:  Denies nocturia or thyroid problems. HEMATOLOGIC AND LYMPHATIC: Denies easy bruising or bleeding. SKIN: Denies rash or lesion. MUSCULOSKELETAL: Denies any pain while resting NEUROLOGIC: Denies paralysis, paresthesias.  PSYCHIATRIC: Denies anxiety or depressive symptoms.   VITAL SIGNS:  Blood pressure (!) 117/52, pulse 83, temperature (!) 97.5 F (36.4 C), temperature source Oral, resp. rate 18, height 5\' 7"  (1.702 m), weight 63.5 kg (140 lb), SpO2 92 %.  I/O:    Intake/Output Summary (Last 24 hours) at 11/22/16 1209 Last data filed at 11/22/16 3614  Gross per 24 hour  Intake             2165 ml  Output              190 ml  Net             1975 ml    PHYSICAL EXAMINATION:  GENERAL:  83 y.o.-year-old patient lying in the bed with no acute distress.  EYES: Pupils equal, round, reactive to light and accommodation. No scleral icterus. Extraocular muscles intact.  HEENT: Head atraumatic, normocephalic. Oropharynx and nasopharynx clear.  NECK:  Supple, no jugular venous distention. No thyroid enlargement, no tenderness.  LUNGS: Normal breath sounds bilaterally, no wheezing, rales,rhonchi or crepitation. No use of accessory muscles of respiration.  CARDIOVASCULAR: S1, S2 normal. No murmurs, rubs, or gallops.  ABDOMEN: Soft, non-tender, non-distended. Bowel sounds present. No organomegaly or mass.  EXTREMITIES: Left hip in clean dressing status post surgery No pedal edema, cyanosis, or clubbing.  NEUROLOGIC: Cranial nerves II through XII are intact. Muscle strength 5/5 in all extremities except left lower extremity. Sensation intact. Gait not checked.  PSYCHIATRIC: The patient is alert and oriented x 3.  SKIN: No obvious rash, lesion, or ulcer.   DATA REVIEW:   CBC  Recent Labs Lab 11/22/16 0356  WBC 10.0  HGB 10.3*  HCT 29.6*  PLT 154    Chemistries   Recent Labs Lab 11/19/16 1656  11/22/16 0356  NA 138  < > 139  K 4.3  < > 4.1  CL 99*  < > 107  CO2 29  < > 28   GLUCOSE 102*  < > 127*  BUN 22*  < > 38*  CREATININE 0.94  < > 0.85  CALCIUM 9.3  < > 8.4*  AST 28  --   --   ALT 17  --   --   ALKPHOS 55  --   --   BILITOT 2.2*  --   --   < > = values in this interval not displayed.  Cardiac Enzymes  Recent Labs Lab 11/19/16 1656  TROPONINI <  0.03    Microbiology Results  Results for orders placed or performed during the hospital encounter of 11/19/16  Blood culture (routine x 2)     Status: None (Preliminary result)   Collection Time: 11/19/16  5:35 PM  Result Value Ref Range Status   Specimen Description BLOOD RIGHT ANTECUBITAL  Final   Special Requests   Final    BOTTLES DRAWN AEROBIC AND ANAEROBIC Blood Culture adequate volume   Culture NO GROWTH 3 DAYS  Final   Report Status PENDING  Incomplete  Blood culture (routine x 2)     Status: None (Preliminary result)   Collection Time: 11/19/16  5:48 PM  Result Value Ref Range Status   Specimen Description BLOOD LEFT ANTECUBITAL  Final   Special Requests   Final    BOTTLES DRAWN AEROBIC AND ANAEROBIC Blood Culture adequate volume   Culture NO GROWTH 3 DAYS  Final   Report Status PENDING  Incomplete  Surgical PCR screen     Status: None   Collection Time: 11/19/16  8:30 PM  Result Value Ref Range Status   MRSA, PCR NEGATIVE NEGATIVE Final   Staphylococcus aureus NEGATIVE NEGATIVE Final    Comment: (NOTE) The Xpert SA Assay (FDA approved for NASAL specimens in patients 36 years of age and older), is one component of a comprehensive surveillance program. It is not intended to diagnose infection nor to guide or monitor treatment.     RADIOLOGY:  Dg Chest 1 View  Result Date: 11/19/2016 CLINICAL DATA:  Initial evaluation for acute hypoxia, concern for pneumonia. EXAM: CHEST 1 VIEW COMPARISON:  Prior radiograph from 02/13/2008. FINDINGS: Median sternotomy wires underlying CABG markers. Mild cardiomegaly. Mediastinal silhouette normal. Aortic atherosclerosis. Lungs hypoinflated. Patchy  and linear bibasilar opacities, which may reflect atelectasis or infiltrates. Changes greater on the left. Underlying COPD. No pulmonary edema or pleural effusion. No pneumothorax. No acute osseus abnormality. IMPRESSION: 1. Shallow lung inflation with patchy and linear bibasilar opacities, left greater than right. Findings may reflect atelectasis or possibly infiltrates. 2. Underlying COPD. 3. Aortic atherosclerosis. Electronically Signed   By: Jeannine Boga M.D.   On: 11/19/2016 17:06   Ct Head Wo Contrast  Result Date: 11/19/2016 CLINICAL DATA:  83 year old male with head trauma following fall today. Initial encounter. EXAM: CT HEAD WITHOUT CONTRAST TECHNIQUE: Contiguous axial images were obtained from the base of the skull through the vertex without intravenous contrast. COMPARISON:  None. FINDINGS: Brain: No evidence of acute infarction, hemorrhage, hydrocephalus, extra-axial collection or mass lesion/mass effect. Atrophy and mild chronic small-vessel white matter ischemic changes noted. Vascular: Intracranial atherosclerotic calcifications noted. Skull: Normal. Negative for fracture or focal lesion. Sinuses/Orbits: No acute finding. Other: None. IMPRESSION: 1. No evidence of acute intracranial abnormality 2. Atrophy and mild chronic small-vessel white matter ischemic changes. Electronically Signed   By: Margarette Canada M.D.   On: 11/19/2016 16:32   Dg Hip Operative Unilat W Or W/o Pelvis Left  Result Date: 11/20/2016 CLINICAL DATA:  Left hip arthroplasty EXAM: OPERATIVE left HIP (WITH PELVIS IF PERFORMED) 1 VIEWS TECHNIQUE: Fluoroscopic spot image(s) were submitted for interpretation post-operatively. COMPARISON:  11/19/2016 FINDINGS: Total fluoroscopy time was 10 seconds. A single low resolution intraoperative view of the left hip shows no orthopedic hardware. No dislocation evident. IMPRESSION: Intraoperative fluoroscopic image of the left hip. Electronically Signed   By: Donavan Foil M.D.   On:  11/20/2016 18:46   Dg Hip Unilat W Or W/o Pelvis 2-3 Views Left  Result Date: 11/20/2016 CLINICAL  DATA:  Initial evaluation status post hip replacement. EXAM: DG HIP (WITH OR WITHOUT PELVIS) 2-3V LEFT COMPARISON:  Prior radiograph from 11/19/2016. FINDINGS: Postoperative changes from interval left total hip arthroplasty. Prosthetic and femoral components appear well seated. No periprosthetic fracture or other complication. Associated postoperative soft tissue swelling and emphysema overlies the hip. Skin staples in place. IMPRESSION: Postoperative changes from interval left total hip arthroplasty without complication. Electronically Signed   By: Jeannine Boga M.D.   On: 11/20/2016 19:23   Dg Hip Unilat With Pelvis 2-3 Views Left  Result Date: 11/19/2016 CLINICAL DATA:  Initial evaluation for acute trauma, fall. EXAM: DG HIP (WITH OR WITHOUT PELVIS) 2-3V LEFT COMPARISON:  None. FINDINGS: Linear lucency with cortical irregularity extending through the left femoral neck, suspicious for acute subcapital fracture. No significant displacement. Femoral head remains normally position within the acetabulum. Femoral head height preserved. No acute osseus abnormality about the visualized bony pelvis and right hip. Osteopenia. Prominent degenerative changes noted within lower lumbar spine with sequelae of prior vertebral augmentation. No acute soft tissue abnormality. Prominent vascular calcifications. IMPRESSION: Linear lucency with cortical regularity extending through the left femoral neck, suspicious for acute nondisplaced subcapital fracture. Electronically Signed   By: Jeannine Boga M.D.   On: 11/19/2016 17:05    EKG:   Orders placed or performed during the hospital encounter of 11/19/16  . ED EKG  . ED EKG  . EKG 12-Lead  . EKG 12-Lead      Management plans discussed with the patient, family and they are in agreement.  CODE STATUS:     Code Status Orders        Start      Ordered   11/19/16 1940  Do not attempt resuscitation (DNR)  Continuous    Question Answer Comment  In the event of cardiac or respiratory ARREST Do not call a "code blue"   In the event of cardiac or respiratory ARREST Do not perform Intubation, CPR, defibrillation or ACLS   In the event of cardiac or respiratory ARREST Use medication by any route, position, wound care, and other measures to relive pain and suffering. May use oxygen, suction and manual treatment of airway obstruction as needed for comfort.   Comments RN may pronounce      11/19/16 1939    Code Status History    Date Active Date Inactive Code Status Order ID Comments User Context   11/19/2016  7:25 PM 11/19/2016  7:39 PM Full Code [id number]  Hessie Knows, MD ED   08/01/2015  4:04 PM 11/19/2016  3:57 PM DNR [id number]  Kathrine Haddock, NP Outpatient    Advance Directive Documentation     Most Recent Value  Type of Advance Directive  Healthcare Power of Attorney, Living will  Pre-existing out of facility DNR order (yellow form or pink MOST form)  -  "MOST" Form in Place?  -      TOTAL TIME TAKING CARE OF THIS PATIENT: 45  minutes.   Note: This dictation was prepared with Dragon dictation along with smaller phrase technology. Any transcriptional errors that result from this process are unintentional.   @MEC @  on 11/22/2016 at 12:09 PM  Between 7am to 6pm - Pager - [phone]  After 6pm go to [email or web address] - password [email or web address] [facility]  [facility]  Office  [phone]  CC: Primary care physician; Katheren Shams

## 2016-11-22 NOTE — Telephone Encounter (Signed)
TCM ph [facility] for syncope  Needs 2 week fu  Scheduled 12/04/16 End at 2 pm

## 2016-11-22 NOTE — Progress Notes (Signed)
Pt has not voided since Foley catheter discontinued. Bladder scan done and revealed 33ml. Dr Marcille Blanco paged and ordered for NS 1 liter bolus. Will administer and continue to monitor.

## 2016-11-22 NOTE — Progress Notes (Signed)
   Subjective: 2 Days Post-Op Procedure(s) (LRB): ARTHROPLASTY BIPOLAR HIP (HEMIARTHROPLASTY) (Left) Patient reports pain as 0 on 0-10 scale. Only complains of soreness to the left anterior thigh   Patient is well, and has had no acute complaints or problems Denies any CP, SOB, ABD pain. We will continue physical therapy today.  Plan is to go Skilled nursing facility after hospital stay.  Objective: Vital signs in last 24 hours: Temp:  [97.5 F (36.4 C)-98.6 F (37 C)] 97.5 F (36.4 C) (09/27 0801) Pulse Rate:  [83-92] 83 (09/27 0801) Resp:  [16-18] 18 (09/27 0801) BP: (110-145)/(52-65) 117/52 (09/27 0801) SpO2:  [91 %-98 %] 92 % (09/27 0801)  Intake/Output from previous day: 09/26 0701 - 09/27 0700 In: 2532 [P.O.:960; I.V.:122; IV Piggyback:1250] Out: 190 [Urine:190] Intake/Output this shift: No intake/output data recorded.   Recent Labs  11/19/16 1656 11/20/16 0429 11/21/16 0504 11/22/16 0356  HGB 15.2 14.5 11.7* 10.3*    Recent Labs  11/21/16 0504 11/22/16 0356  WBC 15.7* 10.0  RBC 3.78* 3.22*  HCT 34.5* 29.6*  PLT 186 154    Recent Labs  11/21/16 0504 11/22/16 0356  NA 139 139  K 4.2 4.1  CL 109 107  CO2 26 28  BUN 31* 38*  CREATININE 1.01 0.85  GLUCOSE 153* 127*  CALCIUM 8.2* 8.4*   No results for input(s): LABPT, INR in the last 72 hours.  EXAM General - Patient is Alert, Appropriate and Oriented Extremity - Sensation intact distally Intact pulses distally Dorsiflexion/Plantar flexion intact No cellulitis present Compartment soft Dressing - scant drainage, stable no new drainage today. Motor Function - intact, moving foot and toes well on exam.   Past Medical History:  Diagnosis Date  . Atrial fibrillation ([place])    a. Seen on ecg 07/2015 - pt unaware of history and is not on [place]. Per outpt clinic note, pt refused eval. CHA2DS2VASc = 4.  . Basal cell carcinoma   . CAD (coronary artery disease)    a. 1999 s/p CABG @ [facility] - presumably x  3 (2 VG markers on CXR, presume LIMA used).  . COPD (chronic obstructive pulmonary disease) ([place])   . Hyperlipidemia   . Hypertension     Assessment/Plan:   2 Days Post-Op Procedure(s) (LRB): ARTHROPLASTY BIPOLAR HIP (HEMIARTHROPLASTY) (Left) Active Problems:   COPD with acute exacerbation (HCC)  Estimated body mass index is 21.93 kg/m as calculated from the following:   Height as of this encounter: 5\' 7"  (1.702 m).   Weight as of this encounter: 63.5 kg (140 lb). Advance diet Up with therapy  Needs bowel movement Acute post op blood loss anemia - hemoglobin 10.3, stable Vital signs are stable Discharge to skilled nursing facility per medicine Follow-up with [facility] clinic orthopedics in 2 weeks for staple removal and Steri-Strip application. Continue with Lovenox 40 mg daily 14 days at discharge   DVT Prophylaxis - Lovenox, Foot Pumps and TED hose Weight-Bearing as tolerated to left leg   T. Rachelle Hora, PA-C [place] 11/22/2016, 8:27 AM

## 2016-11-22 NOTE — Progress Notes (Signed)
Patient is medically stable for D/C to [facility] today. Per [facility] liaison patient can come today to room 809. RN will call report to RN Yaakov Guthrie at [phone] and arrange EMS for transport. Clinical Education officer, museum (CSW) sent D/C orders to [facility] via [facility]. [facility] authorization has been received. Patient is aware of above. CSW contacted patient's daughter Helene Kelp and made her aware of above. Please reconsult if future social work needs arise. CSW signing off.   [employer], LCSW [phone]

## 2016-11-22 NOTE — Progress Notes (Signed)
Physical Therapy Treatment Patient Details Name: Billy Choi MRN: [id number] DOB: 1922-01-24 Today's Date: 11/22/2016    History of Present Illness Billy Choi is a 83 y.o. male with a known history of chronic atrial fibrillation, hypertension, hyperlipidemia is brought into the ED after he sustained a fall. Patient was short of breath x-ray has revealed left greater than right infiltrates and also noticed wheezing. Patient is started on IV antibiotics and Solu-Medrol for bronchoconstriction. X-ray has revealed left hip fracture. He is now POD#1 s/p L hemiarthroplasty.     PT Comments    Pt had just returned to bed upon arrival so bed mobility, transfers, and ambulation deferred during PM session. He is able to complete all bed exercises as instructed with improving L hip strength noted. Pt reports increase in L hip pain during exercises but it does not limit his ability to finish all of his repetitions. Pt will benefit from PT services to address deficits in strength, balance, and mobility in order to return to full function at home.     Follow Up Recommendations  SNF     Equipment Recommendations  Other (comment) (TBD at next venue)    Recommendations for Other Services       Precautions / Restrictions Precautions Precautions: Anterior Hip;Fall Precaution Booklet Issued: Yes (comment) Restrictions Weight Bearing Restrictions: Yes LLE Weight Bearing: Weight bearing as tolerated    Mobility  Bed Mobility Overal bed mobility: Needs Assistance Bed Mobility: Supine to Sit     Supine to sit: Mod assist     General bed mobility comments: Pt just transferred back to bed upon arrival. Deferred bed mobilty, transfers, or ambulation for PM session  Transfers Overall transfer level: Needs assistance Equipment used: Rolling walker (2 wheeled) Transfers: Sit to/from Stand Sit to Stand: Min assist         General transfer comment: Continues to require cues for safe hand  placement and sequencing with transfer. Improved weight shift to LLE but remains decreased. Improved speed today but still requires increased time to come to standing. Performed transfer to [facility] with patient. Cues required for safe hand placement during both phase of toilet transfer  Ambulation/Gait Ambulation/Gait assistance: Mod assist Ambulation Distance (Feet): 80 Feet Assistive device: Rolling walker (2 wheeled) Gait Pattern/deviations: Decreased step length - right;Decreased step length - left;Decreased weight shift to left Gait velocity: Decreased Gait velocity interpretation: <1.8 ft/sec, indicative of risk for recurrent falls General Gait Details: Pt demonstrates significant improvement in ambulation today with therapy. He continues to require minA+1 for occasional instability and sequencing with walker but is much more steady. Pt reports increase in soreness in L hip with ambulation but continues to deny pain. SaO2 95% following ambulation and pt denies DOE. Chair follow for safety during ambulation   Stairs            Wheelchair Mobility    Modified Rankin (Stroke Patients Only)       Balance Overall balance assessment: Needs assistance Sitting-balance support: No upper extremity supported Sitting balance-Leahy Scale: Fair     Standing balance support: No upper extremity supported Standing balance-Leahy Scale: Poor                              Cognition Arousal/Alertness: Awake/alert Behavior During Therapy: WFL for tasks assessed/performed Overall Cognitive Status: Within Functional Limits for tasks assessed  General Comments: AOx3 today with partial orientation to situation      Exercises Total Joint Exercises Ankle Circles/Pumps: Strengthening;Both;20 reps;Supine Quad Sets: Strengthening;Both;Supine;20 reps Gluteal Sets: Strengthening;Both;Supine;20 reps Towel Squeeze: Strengthening;Both;Supine;20  reps Short Arc Quad: Strengthening;Left;Supine;20 reps Heel Slides: Strengthening;Left;Supine;20 reps Hip ABduction/ADduction: Strengthening;20 reps;Supine;Left Straight Leg Raises: Strengthening;Left;Supine;20 reps Long Arc Quad: Strengthening;Left;Seated;15 reps Knee Flexion: Strengthening;Left;Seated;15 reps Marching in Standing: Strengthening;Both;Seated;15 reps Other Exercises Other Exercises: Supine partial bridges x 20    General Comments        Pertinent Vitals/Pain Pain Assessment: 0-10 Pain Score: 8  Pain Location: Denies L hip pain at rest but reports 8/10 L hip pain with movement. Pain Intervention(s): Monitored during session    Home Living                      Prior Function            PT Goals (current goals can now be found in the care plan section) Acute Rehab PT Goals Patient Stated Goal: Return to prior function  PT Goal Formulation: With patient Time For Goal Achievement: 12/05/16 Potential to Achieve Goals: Fair Progress towards PT goals: Progressing toward goals    Frequency    BID      PT Plan Current plan remains appropriate    Co-evaluation              AM-PAC PT "6 Clicks" Daily Activity  Outcome Measure  Difficulty turning over in bed (including adjusting bedclothes, sheets and blankets)?: Unable Difficulty moving from lying on back to sitting on the side of the bed? : Unable Difficulty sitting down on and standing up from a chair with arms (e.g., wheelchair, bedside commode, etc,.)?: Unable Help needed moving to and from a bed to chair (including a wheelchair)?: A Lot Help needed walking in hospital room?: A Lot Help needed climbing 3-5 steps with a railing? : Total 6 Click Score: 8    End of Session Equipment Utilized During Treatment: Gait belt Activity Tolerance: Patient tolerated treatment well Patient left: with call bell/phone within reach;with SCD's reapplied;Other (comment);in bed;with bed alarm set  (pillow under LLE)   PT Visit Diagnosis: Unsteadiness on feet (R26.81);Other abnormalities of gait and mobility (R26.89);History of falling (Z91.81);Muscle weakness (generalized) (M62.81);Pain Pain - Right/Left: Left Pain - part of body: Hip     Time: 3903-0092 PT Time Calculation (min) (ACUTE ONLY): 12 min  Charges:  $Gait Training: 8-22 mins $Therapeutic Exercise: 8-22 mins                    G Codes:       Lyndel Safe Marynell Bies PT, DPT     Alfredo Collymore 11/22/2016, 11:59 AM

## 2016-11-22 NOTE — Progress Notes (Signed)
EMS called for transport to [facility]

## 2016-11-22 NOTE — Progress Notes (Signed)
Pt unable to void, given 1 liter NS bolus per order. Pt claims "no urge to pee". Dr Marcille Blanco made aware, said to wait a little bit and monitor.

## 2016-11-22 NOTE — Progress Notes (Signed)
Patient left via EMS

## 2016-11-22 NOTE — Discharge Instructions (Signed)
Follow-up with primary care physician at [facility] in 5-7 days Follow-up with orthopedics  in 2 weeks    ANTERIOR APPROACH HEMI HIP REPLACEMENT POSTOPERATIVE DIRECTIONS   Hip Rehabilitation, Guidelines Following Surgery  The results of a hip operation are greatly improved after range of motion and muscle strengthening exercises. Follow all safety measures which are given to protect your hip. If any of these exercises cause increased pain or swelling in your joint, decrease the amount until you are comfortable again. Then slowly increase the exercises. Call your caregiver if you have problems or questions.   HOME CARE INSTRUCTIONS  Remove items at home which could result in a fall. This includes throw rugs or furniture in walking pathways.   ICE to the affected hip every three hours for 30 minutes at a time and then as needed for pain and swelling.  Continue to use ice on the hip for pain and swelling from surgery. You may notice swelling that will progress down to the foot and ankle.  This is normal after surgery.  Elevate the leg when you are not up walking on it.    Continue to use the breathing machine which will help keep your temperature down.  It is common for your temperature to cycle up and down following surgery, especially at night when you are not up moving around and exerting yourself.  The breathing machine keeps your lungs expanded and your temperature down.  Do not place pillow under knee, focus on keeping the knee straight while resting  DIET You may resume your previous home diet once your are discharged from the hospital.  DRESSING / WOUND CARE / SHOWERING Keep dressing clean and dry. Change dressing only as needed. You may shower after your first follow up appointment with [facility].  ACTIVITY Walk with your walker as instructed. Use walker as long as suggested by your caregivers. Avoid periods of inactivity such as sitting longer than an hour when not  asleep. This helps prevent blood clots.  You may resume a sexual relationship in one month or when given the OK by your doctor.  You may return to work once you are cleared by your doctor.  Do not drive a car for 6 weeks or until released by you surgeon.  Do not drive while taking narcotics.  WEIGHT BEARING Weight bearing as tolerated. Use walker/cane as needed for at least 4 weeks post op.  POSTOPERATIVE CONSTIPATION PROTOCOL Constipation - defined medically as fewer than three stools per week and severe constipation as less than one stool per week.  One of the most common issues patients have following surgery is constipation.  Even if you have a regular bowel pattern at home, your normal regimen is likely to be disrupted due to multiple reasons following surgery.  Combination of anesthesia, postoperative narcotics, change in appetite and fluid intake all can affect your bowels.  In order to avoid complications following surgery, here are some recommendations in order to help you during your recovery period.  Colace (docusate) - Pick up an over-the-counter form of Colace or another stool softener and take twice a day as long as you are requiring postoperative pain medications.  Take with a full glass of water daily.  If you experience loose stools or diarrhea, hold the colace until you stool forms back up.  If your symptoms do not get better within 1 week or if they get worse, check with your doctor.  Dulcolax (bisacodyl) - Pick up over-the-counter and  take as directed by the product packaging as needed to assist with the movement of your bowels.  Take with a full glass of water.  Use this product as needed if not relieved by Colace only.   MiraLax (polyethylene glycol) - Pick up over-the-counter to have on hand.  MiraLax is a solution that will increase the amount of water in your bowels to assist with bowel movements.  Take as directed and can mix with a glass of water, juice, soda, coffee, or  tea.  Take if you go more than two days without a movement. Do not use MiraLax more than once per day. Call your doctor if you are still constipated or irregular after using this medication for 7 days in a row.  If you continue to have problems with postoperative constipation, please contact the office for further assistance and recommendations.  If you experience "the worst abdominal pain ever" or develop nausea or vomiting, please contact the office immediatly for further recommendations for treatment.  ITCHING  If you experience itching with your medications, try taking only a single pain pill, or even half a pain pill at a time.  You can also use Benadryl over the counter for itching or also to help with sleep.   TED HOSE STOCKINGS Wear the elastic stockings on both legs for six weeks following surgery during the day but you may remove then at night for sleeping.  MEDICATIONS See your medication summary on the After Visit Summary that the nursing staff will review with you prior to discharge.  You may have some home medications which will be placed on hold until you complete the course of blood thinner medication.  It is important for you to complete the blood thinner medication as prescribed by your surgeon.  Continue your approved medications as instructed at time of discharge.  PRECAUTIONS If you experience chest pain or shortness of breath - call 911 immediately for transfer to the hospital emergency department.  If you develop a fever greater that 101 F, purulent drainage from wound, increased redness or drainage from wound, foul odor from the wound/dressing, or calf pain - CONTACT YOUR SURGEON.                                                   FOLLOW-UP APPOINTMENTS Make sure you keep all of your appointments after your operation with your surgeon and caregivers. You should call the office at the above phone number and make an appointment for approximately two weeks after the date of  your surgery or on the date instructed by your surgeon outlined in the "After Visit Summary".  RANGE OF MOTION AND STRENGTHENING EXERCISES  These exercises are designed to help you keep full movement of your hip joint. Follow your caregiver's or physical therapist's instructions. Perform all exercises about fifteen times, three times per day or as directed. Exercise both hips, even if you have had only one joint replacement. These exercises can be done on a training (exercise) mat, on the floor, on a table or on a bed. Use whatever works the best and is most comfortable for you. Use music or television while you are exercising so that the exercises are a pleasant break in your day. This will make your life better with the exercises acting as a break in routine you can look forward  to.  Lying on your back, slowly slide your foot toward your buttocks, raising your knee up off the floor. Then slowly slide your foot back down until your leg is straight again.  Lying on your back spread your legs as far apart as you can without causing discomfort.  Lying on your side, raise your upper leg and foot straight up from the floor as far as is comfortable. Slowly lower the leg and repeat.  Lying on your back, tighten up the muscle in the front of your thigh (quadriceps muscles). You can do this by keeping your leg straight and trying to raise your heel off the floor. This helps strengthen the largest muscle supporting your knee.  Lying on your back, tighten up the muscles of your buttocks both with the legs straight and with the knee bent at a comfortable angle while keeping your heel on the floor.   IF YOU ARE TRANSFERRED TO A SKILLED REHAB FACILITY If the patient is transferred to a skilled rehab facility following release from the hospital, a list of the current medications will be sent to the facility for the patient to continue.  When discharged from the skilled rehab facility, please have the facility set up  the patient's [place] prior to being released. Also, the skilled facility will be responsible for providing the patient with their medications at time of release from the facility to include their pain medication, the muscle relaxants, and their blood thinner medication. If the patient is still at the rehab facility at time of the two week follow up appointment, the skilled rehab facility will also need to assist the patient in arranging follow up appointment in our office and any transportation needs.  MAKE SURE YOU:  Understand these instructions.  Get help right away if you are not doing well or get worse.    Pick up stool softner and laxative for home use following surgery while on pain medications. Continue to use ice for pain and swelling after surgery. Do not use any lotions or creams on the incision until instructed by your surgeon.

## 2016-11-22 NOTE — Progress Notes (Signed)
MD paged as Lab called and Sputum Culture previously sent to lab, has been rejected.

## 2016-11-22 NOTE — Progress Notes (Signed)
Report called to Yaakov Guthrie, nurse who will be receiving this patient and [facility]. EMS to be called for transport.

## 2016-11-22 NOTE — Telephone Encounter (Signed)
Currently admitted.

## 2016-11-22 NOTE — Clinical Social Work Placement (Signed)
   CLINICAL SOCIAL WORK PLACEMENT  NOTE  Date:  11/22/2016  Patient Details  Name: Billy Choi MRN: [id number] Date of Birth: 1921-07-17  Clinical Social Work is seeking post-discharge placement for this patient at the [place] level of care (*CSW will initial, date and re-position this form in  chart as items are completed):  Yes   Patient/family provided with [place] Work Department's list of facilities offering this level of care within the geographic area requested by the patient (or if unable, by the patient's family).  Yes   Patient/family informed of their freedom to choose among providers that offer the needed level of care, that participate in Medicare, Medicaid or managed care program needed by the patient, have an available bed and are willing to accept the patient.  Yes   Patient/family informed of [facility]'s ownership interest in [facility] and [facility], as well as of the fact that they are under no obligation to receive care at these facilities.  PASRR submitted to EDS on 11/20/16     PASRR number received on 11/20/16     Existing PASRR number confirmed on       FL2 transmitted to all facilities in geographic area requested by pt/family on 11/20/16     FL2 transmitted to all facilities within larger geographic area on       Patient informed that his/her managed care company has contracts with or will negotiate with certain facilities, including the following:        Yes   Patient/family informed of bed offers received.  [facility]     Physician recommends and patient chooses bed at      [facility] to [facility] to  [facility] ) on 11/22/16.  Patient to be transferred to facility by  [facility] )     Patient family notified on 11/22/16 of transfer.  Name of family member notified:   (Patient's daughter Helene Kelp is aware of D/C today. )     PHYSICIAN       Additional Comment:     _______________________________________________ Janney Priego, Veronia Beets, LCSW 11/22/2016, 12:26 PM

## 2016-11-23 NOTE — Telephone Encounter (Signed)
Patient contacted regarding discharge from [facility] on 11/22/16.  Patient understands to follow up with provider Dr. Saunders Revel on 12/04/16 at 2:00 pm at [facility]. Patient understands discharge instructions? Unsure Patient understands medications and regiment? Unsure Patient understands to bring all medications to this visit? Unsure  Spoke with patients daughter per release form and she states that the patient is now in a nursing home for rehab over at [facility]. She states they are going to meet with them today to discuss his plan. Provided her with his appointment information and advised her to please call back if they have any further questions. She was appreciative for the call and had no further questions or concerns at this time.

## 2016-11-23 NOTE — Telephone Encounter (Signed)
Left voicemail message to call back  

## 2016-11-24 LAB — CULTURE, BLOOD (ROUTINE X 2)
CULTURE: NO GROWTH
Culture: NO GROWTH
SPECIAL REQUESTS: ADEQUATE
Special Requests: ADEQUATE

## 2016-11-27 DIAGNOSIS — J189 Pneumonia, unspecified organism: Secondary | ICD-10-CM | POA: Diagnosis not present

## 2016-11-27 DIAGNOSIS — M6281 Muscle weakness (generalized): Secondary | ICD-10-CM | POA: Diagnosis not present

## 2016-11-27 DIAGNOSIS — S7292XD Unspecified fracture of left femur, subsequent encounter for closed fracture with routine healing: Secondary | ICD-10-CM | POA: Diagnosis not present

## 2016-11-27 DIAGNOSIS — I4891 Unspecified atrial fibrillation: Secondary | ICD-10-CM | POA: Diagnosis not present

## 2016-11-27 DIAGNOSIS — E785 Hyperlipidemia, unspecified: Secondary | ICD-10-CM | POA: Diagnosis not present

## 2016-12-02 NOTE — Progress Notes (Signed)
Erroneous encounter

## 2016-12-04 ENCOUNTER — Encounter: Payer: PPO | Admitting: Internal Medicine

## 2016-12-06 DIAGNOSIS — I4891 Unspecified atrial fibrillation: Secondary | ICD-10-CM | POA: Diagnosis not present

## 2016-12-06 DIAGNOSIS — D649 Anemia, unspecified: Secondary | ICD-10-CM | POA: Diagnosis not present

## 2016-12-06 DIAGNOSIS — I10 Essential (primary) hypertension: Secondary | ICD-10-CM | POA: Diagnosis not present

## 2016-12-06 DIAGNOSIS — S7292XD Unspecified fracture of left femur, subsequent encounter for closed fracture with routine healing: Secondary | ICD-10-CM | POA: Diagnosis not present

## 2016-12-06 DIAGNOSIS — E785 Hyperlipidemia, unspecified: Secondary | ICD-10-CM | POA: Diagnosis not present

## 2016-12-07 ENCOUNTER — Other Ambulatory Visit: Payer: Self-pay | Admitting: *Deleted

## 2016-12-07 NOTE — Patient Outreach (Signed)
[facility] [facility]) Care Management  12/07/2016  Billy Choi [birth year] [id number]   Met with Billy Choi, he states patient will return home upon discharge, anticipate next week.  Met with patient at bedside. Patient sitting in wheelchair alert and orient to self. He states that he lets his daughter, Billy Choi handle everything as he started getting memory issues about 10 years ago. He states he is pretty independent and lives alone. His daughter lives near him. She assists with his business and medical issues. He states he really was not taking any medications but 1/2 and aspirin a day before this, he states he came in worried about his left hip and they have been giving him oxygen and checking his heart.   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  He states he was still driving up until a few months ago, he needs to get his license renewed.   RNCM reviewed [facility] program, patient requested information for daughter to review. RNCM left [facility] packet and RNCM business card Patient unable to give RNCM his daughter's Phone number, he states that it may be on his record with facility.   Plan to follow up as indicated with daughter if RNCM can get phone number from facility.   Royetta Crochet. Laymond Purser, RN, BSN, [place] [phone]) Business Cell  [phone]) Toll Free Office

## 2016-12-11 DIAGNOSIS — E785 Hyperlipidemia, unspecified: Secondary | ICD-10-CM | POA: Diagnosis not present

## 2016-12-11 DIAGNOSIS — S7292XD Unspecified fracture of left femur, subsequent encounter for closed fracture with routine healing: Secondary | ICD-10-CM | POA: Diagnosis not present

## 2016-12-11 DIAGNOSIS — K5900 Constipation, unspecified: Secondary | ICD-10-CM | POA: Diagnosis not present

## 2016-12-11 DIAGNOSIS — I10 Essential (primary) hypertension: Secondary | ICD-10-CM | POA: Diagnosis not present

## 2016-12-11 DIAGNOSIS — I4891 Unspecified atrial fibrillation: Secondary | ICD-10-CM | POA: Diagnosis not present

## 2016-12-13 ENCOUNTER — Other Ambulatory Visit: Payer: Self-pay | Admitting: *Deleted

## 2016-12-13 NOTE — Patient Outreach (Signed)
[place] [facility]) Care Management  12/13/2016  Billy Choi 21-Nov-1921 [id number]   Patient discharged from facility this am. [name], SW at facility gave daughter name and number for [facility] care management follow up. He states [facility] was going to work with patient and daughter was Engineer, maintenance (IT) duty caregiver. [name] is daughter: [phone] or [phone].  Plan will give daughter name and number to [facility] care team [facility]. Laymond Purser, RN, BSN, [place] [phone]) Business Cell  [phone]) Toll Free Office

## 2016-12-14 ENCOUNTER — Other Ambulatory Visit: Payer: Self-pay | Admitting: *Deleted

## 2016-12-14 NOTE — Patient Outreach (Signed)
[place] [facility]) Care Management  12/14/2016  Billy Choi 1921-03-21 [id number]  Transition of Care Referral  Referral Date: 12/13/16 Referral Source: HTA Date of Discharge: 12/13/16 Facility: [facility] Discharge Diagnosis: Syncope, Fall, Closed fracture to left femur, COPD exacerbation. Insurance: HTA  Outreach attempt to patient. HIPAA verified with patient. Patient lives alone and recently had a fall with injury. Patient stated, he was waiting on a lady to come to his house. He didn't have any idea what time she would arrive. Patient requested RN CM to speak with his daughter Billy Choi). He stated, "He doesn't know anything and he has been getting a lot of calls". RN CM made an outreach phone call to patient's daughter, per request. She stated, she can't provide [place] any information on the patient. Billy Choi stated, "The patient should be in a home/ALF". She explained, she and her sister are not capable of taking care of the patient. Billy Choi verbalized, she has spinal stenosis and other health issues. She discussed, her sister has multiple medical issues, in addition. Billy Choi reported, patient hired [facility] to assist with his care. Per daughter, they don't know what the services consist of. Daughter doesn't know if patient is taking his medications as prescribed. She couldn't provide information about his follow-up MD visit. Billy Choi stated, patient has a medical alert device at his daughter's home in [place]. She stated, her sister will bring the medical alert device to patient, when she is capable. [facility] services and benefits explained to [place]. RN CM informed Billy Choi about referral being made to [employer] and SW. Billy Choi stated, "She doesn't know if the patient willingly the patient will be due to him being set in his ways".   Outreach phone call made to patient. HIPAA verified with patient. RN CM restated information provided by Billy Choi to patient. RN CM  informed patient of referral being made to [facility] and SW.   Plan: RN CM will send referral to [facility] RN for further in home eval/assessment of care needs and management of chronic conditions. RN CM will send referral to [place] for transition of care. RN CM advised patient that [facility] community RN CM would follow up and contact patient within the next 10 days. RN CM will send [facility] SW referral for possible assistance with community resources and/or in-home safety evaluation.   Lake Bells, RN, BSN, MHA/MSL, [place] Telephonic Care Manager Coordinator [facility] Direct Phone: [phone] Toll Free: [phone] Fax: [phone]

## 2016-12-18 ENCOUNTER — Other Ambulatory Visit: Payer: Self-pay | Admitting: *Deleted

## 2016-12-18 DIAGNOSIS — J449 Chronic obstructive pulmonary disease, unspecified: Secondary | ICD-10-CM | POA: Diagnosis not present

## 2016-12-18 DIAGNOSIS — M8000XD Age-related osteoporosis with current pathological fracture, unspecified site, subsequent encounter for fracture with routine healing: Secondary | ICD-10-CM | POA: Diagnosis not present

## 2016-12-18 DIAGNOSIS — I25810 Atherosclerosis of coronary artery bypass graft(s) without angina pectoris: Secondary | ICD-10-CM | POA: Diagnosis not present

## 2016-12-18 NOTE — Patient Outreach (Signed)
Successful telephone encounter to Billy Choi, 83 year old male, follow up on referral received 12/14/16  from [place] coordinator for Community CM services/transiiton of care/recent discharge from [facility] 12/13/16, admitted to rehab following recent hospitalization September 24-27,2018 for Syncope and collapse, fall, acute respiratory failure, closed fracture neck of left femur.   Pt's history includes but  Not limited to CAD, COPD, Atrial fib, Hypertension, Hyperlipidemia.   Spoke with pt, discussed purpose of call, refused to provide HIPAA, states needs to talk to person with him to which handed that person the  phone.  Spoke with Arlie Solomons who identified herself as pt's caregiver, HIPAA identifiers provided on pt.  RN CM discussed with Manuela Schwartz purpose of call to pt /follow up on referral from [place] for Community CM services/recent SNF discharge.  Manuela Schwartz reports she went with pt today to see PCP who ordered [facility] which is all pt needs.  Manuela Schwartz states she is coming daily to check on pt, does 4 hours a day, walks pt.   RN CM relayed to Manuela Schwartz  the difference between home health services and [facility] services, can work together, [facility] a benefit of his insurance/no cost.   Manuela Schwartz reports she talked to pt's daughter and it was relayed to her that [facility] services were cancelled.   Provided Susan(pt's caregiver) with RN CM's direct phone number as well as [facility]'s main office number if needs arise for pt in the future.     Plan:  RN CM to inform [facility] CMA that [facility] services declined.    Zara Chess.   [facility]  [phone]    .

## 2017-01-07 DIAGNOSIS — Z96649 Presence of unspecified artificial hip joint: Secondary | ICD-10-CM | POA: Diagnosis not present

## 2017-01-24 ENCOUNTER — Ambulatory Visit
Admission: RE | Admit: 2017-01-24 | Discharge: 2017-01-24 | Disposition: A | Discharge status: Home / Self Care | Payer: PPO | Source: Ambulatory Visit | Attending: Internal Medicine | Admitting: Internal Medicine

## 2017-01-24 ENCOUNTER — Other Ambulatory Visit: Payer: Self-pay | Admitting: Internal Medicine

## 2017-01-24 DIAGNOSIS — R05 Cough: Secondary | ICD-10-CM | POA: Diagnosis not present

## 2017-01-24 DIAGNOSIS — J189 Pneumonia, unspecified organism: Secondary | ICD-10-CM

## 2017-01-24 DIAGNOSIS — Z09 Encounter for follow-up examination after completed treatment for conditions other than malignant neoplasm: Secondary | ICD-10-CM | POA: Insufficient documentation

## 2017-01-24 DIAGNOSIS — Z8701 Personal history of pneumonia (recurrent): Secondary | ICD-10-CM | POA: Insufficient documentation

## 2017-01-24 DIAGNOSIS — J984 Other disorders of lung: Secondary | ICD-10-CM | POA: Insufficient documentation

## 2017-01-24 DIAGNOSIS — R0602 Shortness of breath: Secondary | ICD-10-CM | POA: Diagnosis not present

## 2017-01-24 DIAGNOSIS — J449 Chronic obstructive pulmonary disease, unspecified: Secondary | ICD-10-CM | POA: Diagnosis not present

## 2017-01-24 DIAGNOSIS — J181 Lobar pneumonia, unspecified organism: Secondary | ICD-10-CM | POA: Diagnosis not present

## 2017-01-30 ENCOUNTER — Ambulatory Visit: Payer: PPO | Admitting: Internal Medicine

## 2017-01-30 NOTE — Progress Notes (Deleted)
Follow-up Outpatient Visit Date: 01/30/2017  Primary Care Provider: Kirk Ruths, MD [place] [address]  Chief Complaint: ***  HPI:  Billy Choi is a 83 y.o. year-old male with history of coronary artery disease status post CABG (1999), atrial fibrillation, hypertension, hyperlipidemia, and COPD, who presents for follow-up of atrial fibrillation.  I met Billy Choi once in the setting of hip fracture in September.  --------------------------------------------------------------------------------------------------  ***  Recent CV Pertinent Labs: Lab Results  Component Value Date   CHOL 141 02/09/2016   HDL 51 02/09/2016   LDLCALC 69 02/09/2016   TRIG 107 02/09/2016   K 4.1 11/22/2016   K 4.5 08/09/2013   BUN 38 (H) 11/22/2016   BUN 17 02/09/2016   BUN 21 (H) 08/09/2013   CREATININE 0.85 11/22/2016   CREATININE 1.33 (H) 08/09/2013    Past medical and surgical history were reviewed and updated in EPIC.  No outpatient medications have been marked as taking for the 01/30/17 encounter (Appointment) with Yuma Blucher, Billy Gave, MD.    Allergies: Lipitor [atorvastatin]  Social History   Socioeconomic History  . Marital status: Unknown    Spouse name: Not on file  . Number of children: Not on file  . Years of education: Not on file  . Highest education level: Not on file  Social Needs  . Financial resource strain: Not on file  . Food insecurity - worry: Not on file  . Food insecurity - inability: Not on file  . Transportation needs - medical: Not on file  . Transportation needs - non-medical: Not on file  Occupational History  . Occupation: Retired  Tobacco Use  . Smoking status: Former Smoker    Packs/day: 1.00    Years: 40.00    Pack years: 40.00  . Smokeless tobacco: Current User    Types: Chew  . Tobacco comment: quite 50 yrs ago  Substance and Sexual Activity  . Alcohol use: Yes    Comment: wine on occasion  .  Drug use: No  . Sexual activity: No  Other Topics Concern  . Not on file  Social History Narrative   Lives in [place] by himself.  Does not routinely exercise but is active around his home w/o limitations.    Family History  Problem Relation Age of Onset  . Arthritis Father     Review of Systems: A 12-system review of systems was performed and was negative except as noted in the HPI.  --------------------------------------------------------------------------------------------------  Physical Exam: There were no vitals taken for this visit.  General:  *** HEENT: No conjunctival pallor or scleral icterus. Moist mucous membranes.  OP clear. Neck: Supple without lymphadenopathy, thyromegaly, JVD, or HJR. No carotid bruit. Lungs: Normal work of breathing. Clear to auscultation bilaterally without wheezes or crackles. Heart: Regular rate and rhythm without murmurs, rubs, or gallops. Non-displaced PMI. Abd: Bowel sounds present. Soft, NT/ND without hepatosplenomegaly Ext: No lower extremity edema. Radial, PT, and DP pulses are 2+ bilaterally. Skin: Warm and dry without rash.  EKG:  ***  Lab Results  Component Value Date   WBC 10.0 11/22/2016   HGB 10.3 (L) 11/22/2016   HCT 29.6 (L) 11/22/2016   MCV 92.0 11/22/2016   PLT 154 11/22/2016    Lab Results  Component Value Date   NA 139 11/22/2016   K 4.1 11/22/2016   CL 107 11/22/2016   CO2 28 11/22/2016   BUN 38 (H) 11/22/2016   CREATININE 0.85  11/22/2016   GLUCOSE 127 (H) 11/22/2016   ALT 17 11/19/2016    Lab Results  Component Value Date   CHOL 141 02/09/2016   HDL 51 02/09/2016   LDLCALC 69 02/09/2016   TRIG 107 02/09/2016    --------------------------------------------------------------------------------------------------  ASSESSMENT AND PLAN: Billy Gave Zerick Prevette, MD 01/30/2017 9:49 AM

## 2017-02-06 ENCOUNTER — Encounter: Payer: Self-pay | Admitting: Internal Medicine

## 2017-03-04 ENCOUNTER — Ambulatory Visit: Payer: Self-pay | Admitting: Internal Medicine

## 2017-05-07 DIAGNOSIS — I25810 Atherosclerosis of coronary artery bypass graft(s) without angina pectoris: Secondary | ICD-10-CM | POA: Diagnosis not present

## 2017-05-07 DIAGNOSIS — E538 Deficiency of other specified B group vitamins: Secondary | ICD-10-CM | POA: Diagnosis not present

## 2017-05-07 DIAGNOSIS — R739 Hyperglycemia, unspecified: Secondary | ICD-10-CM | POA: Diagnosis not present

## 2017-05-07 DIAGNOSIS — I10 Essential (primary) hypertension: Secondary | ICD-10-CM | POA: Diagnosis not present

## 2017-05-14 DIAGNOSIS — Z0000 Encounter for general adult medical examination without abnormal findings: Secondary | ICD-10-CM | POA: Diagnosis not present

## 2017-05-14 DIAGNOSIS — I10 Essential (primary) hypertension: Secondary | ICD-10-CM | POA: Diagnosis not present

## 2017-05-14 DIAGNOSIS — I25810 Atherosclerosis of coronary artery bypass graft(s) without angina pectoris: Secondary | ICD-10-CM | POA: Diagnosis not present

## 2017-05-14 DIAGNOSIS — R739 Hyperglycemia, unspecified: Secondary | ICD-10-CM | POA: Diagnosis not present

## 2017-05-14 DIAGNOSIS — J449 Chronic obstructive pulmonary disease, unspecified: Secondary | ICD-10-CM | POA: Diagnosis not present

## 2017-05-14 DIAGNOSIS — E7800 Pure hypercholesterolemia, unspecified: Secondary | ICD-10-CM | POA: Diagnosis not present

## 2017-11-05 DIAGNOSIS — I10 Essential (primary) hypertension: Secondary | ICD-10-CM | POA: Diagnosis not present

## 2017-11-05 DIAGNOSIS — I25810 Atherosclerosis of coronary artery bypass graft(s) without angina pectoris: Secondary | ICD-10-CM | POA: Diagnosis not present

## 2017-11-05 DIAGNOSIS — E7800 Pure hypercholesterolemia, unspecified: Secondary | ICD-10-CM | POA: Diagnosis not present

## 2017-11-05 DIAGNOSIS — R739 Hyperglycemia, unspecified: Secondary | ICD-10-CM | POA: Diagnosis not present

## 2017-11-12 DIAGNOSIS — Z9989 Dependence on other enabling machines and devices: Secondary | ICD-10-CM | POA: Diagnosis not present

## 2017-11-12 DIAGNOSIS — R634 Abnormal weight loss: Secondary | ICD-10-CM | POA: Diagnosis not present

## 2017-11-12 DIAGNOSIS — J449 Chronic obstructive pulmonary disease, unspecified: Secondary | ICD-10-CM | POA: Diagnosis not present

## 2017-11-12 DIAGNOSIS — E7800 Pure hypercholesterolemia, unspecified: Secondary | ICD-10-CM | POA: Diagnosis not present

## 2017-11-12 DIAGNOSIS — I10 Essential (primary) hypertension: Secondary | ICD-10-CM | POA: Diagnosis not present

## 2017-11-12 DIAGNOSIS — R739 Hyperglycemia, unspecified: Secondary | ICD-10-CM | POA: Diagnosis not present

## 2017-11-12 DIAGNOSIS — I25810 Atherosclerosis of coronary artery bypass graft(s) without angina pectoris: Secondary | ICD-10-CM | POA: Diagnosis not present

## 2018-05-13 DIAGNOSIS — R739 Hyperglycemia, unspecified: Secondary | ICD-10-CM | POA: Diagnosis not present

## 2018-05-13 DIAGNOSIS — R634 Abnormal weight loss: Secondary | ICD-10-CM | POA: Diagnosis not present

## 2018-05-13 DIAGNOSIS — I25810 Atherosclerosis of coronary artery bypass graft(s) without angina pectoris: Secondary | ICD-10-CM | POA: Diagnosis not present

## 2018-05-13 DIAGNOSIS — E7800 Pure hypercholesterolemia, unspecified: Secondary | ICD-10-CM | POA: Diagnosis not present

## 2018-05-20 DIAGNOSIS — Z0000 Encounter for general adult medical examination without abnormal findings: Secondary | ICD-10-CM | POA: Diagnosis not present

## 2018-05-20 DIAGNOSIS — E440 Moderate protein-calorie malnutrition: Secondary | ICD-10-CM | POA: Diagnosis not present

## 2018-05-20 DIAGNOSIS — Z9989 Dependence on other enabling machines and devices: Secondary | ICD-10-CM | POA: Diagnosis not present

## 2018-05-20 DIAGNOSIS — I10 Essential (primary) hypertension: Secondary | ICD-10-CM | POA: Diagnosis not present

## 2018-05-20 DIAGNOSIS — I25810 Atherosclerosis of coronary artery bypass graft(s) without angina pectoris: Secondary | ICD-10-CM | POA: Diagnosis not present

## 2018-05-20 DIAGNOSIS — J449 Chronic obstructive pulmonary disease, unspecified: Secondary | ICD-10-CM | POA: Diagnosis not present

## 2018-05-27 IMAGING — DX DG HIP (WITH OR WITHOUT PELVIS) 2-3V*L*
2 series · 2 of 2 positions shown · non-contrast
Comparison: Prior radiograph from 11/19/2016.

CLINICAL DATA: Initial evaluation status post hip replacement.

EXAM:
DG HIP (WITH OR WITHOUT PELVIS) 2-3V LEFT

[hip ap]
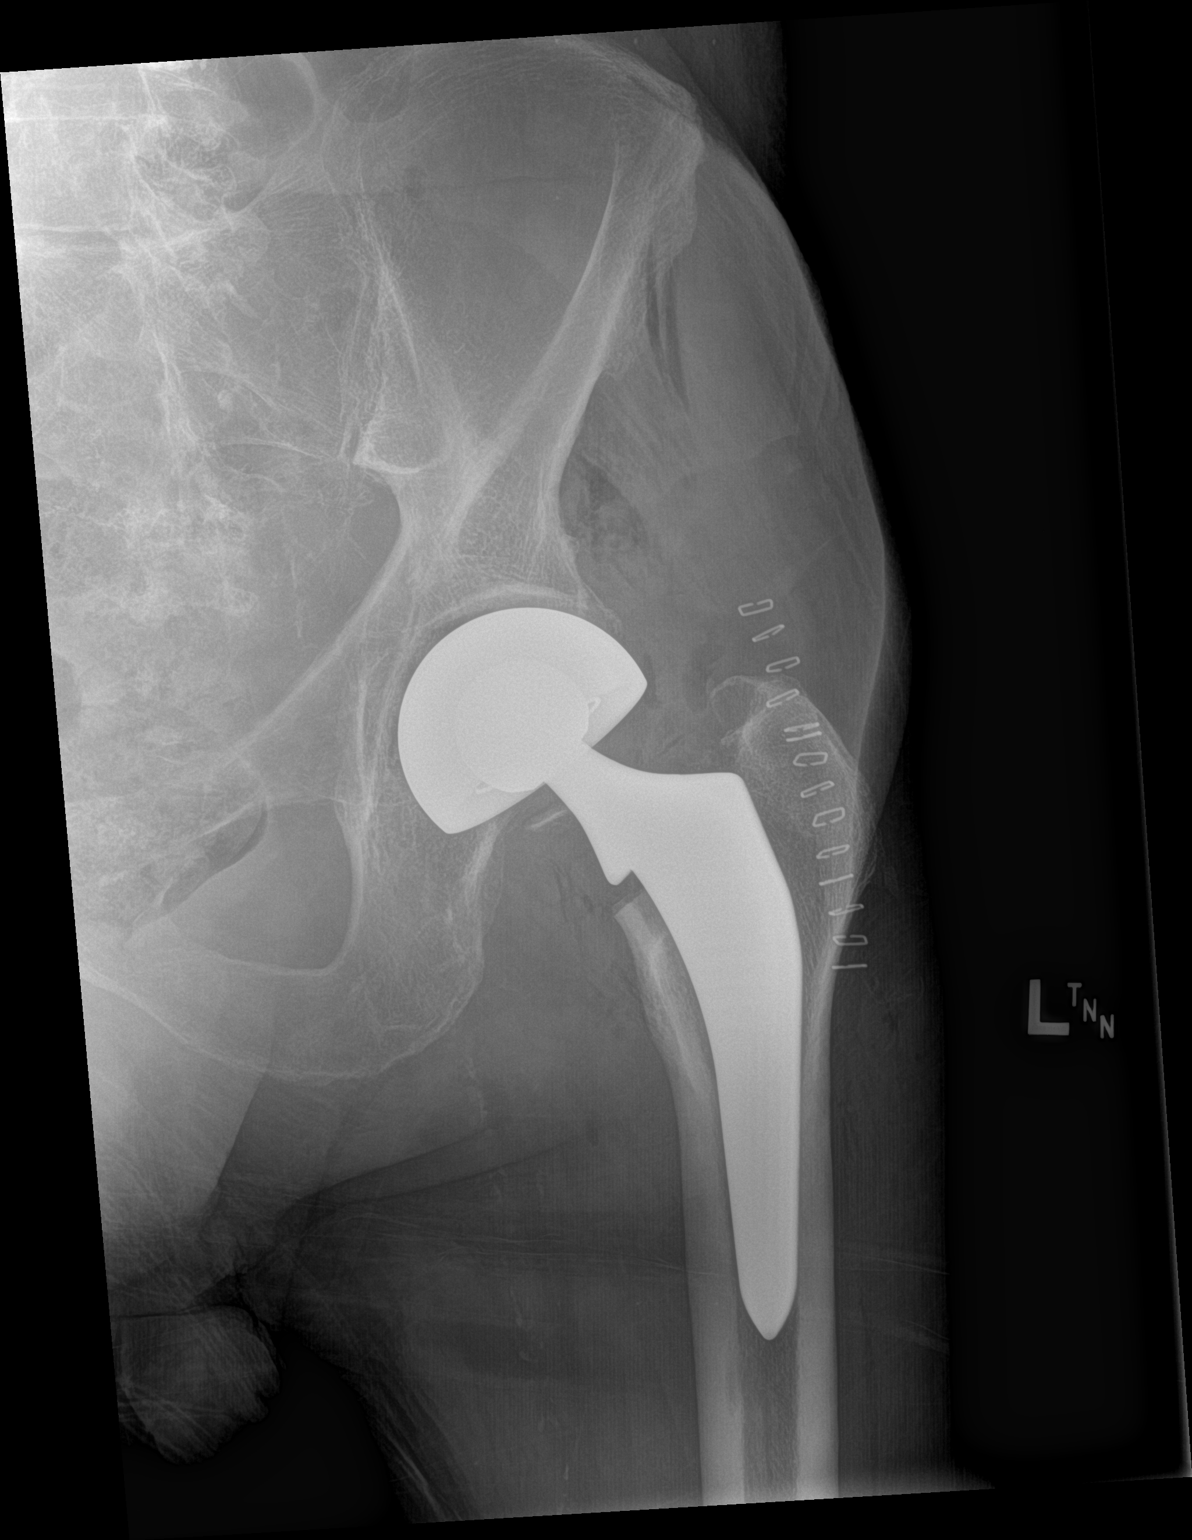

[hip lat]
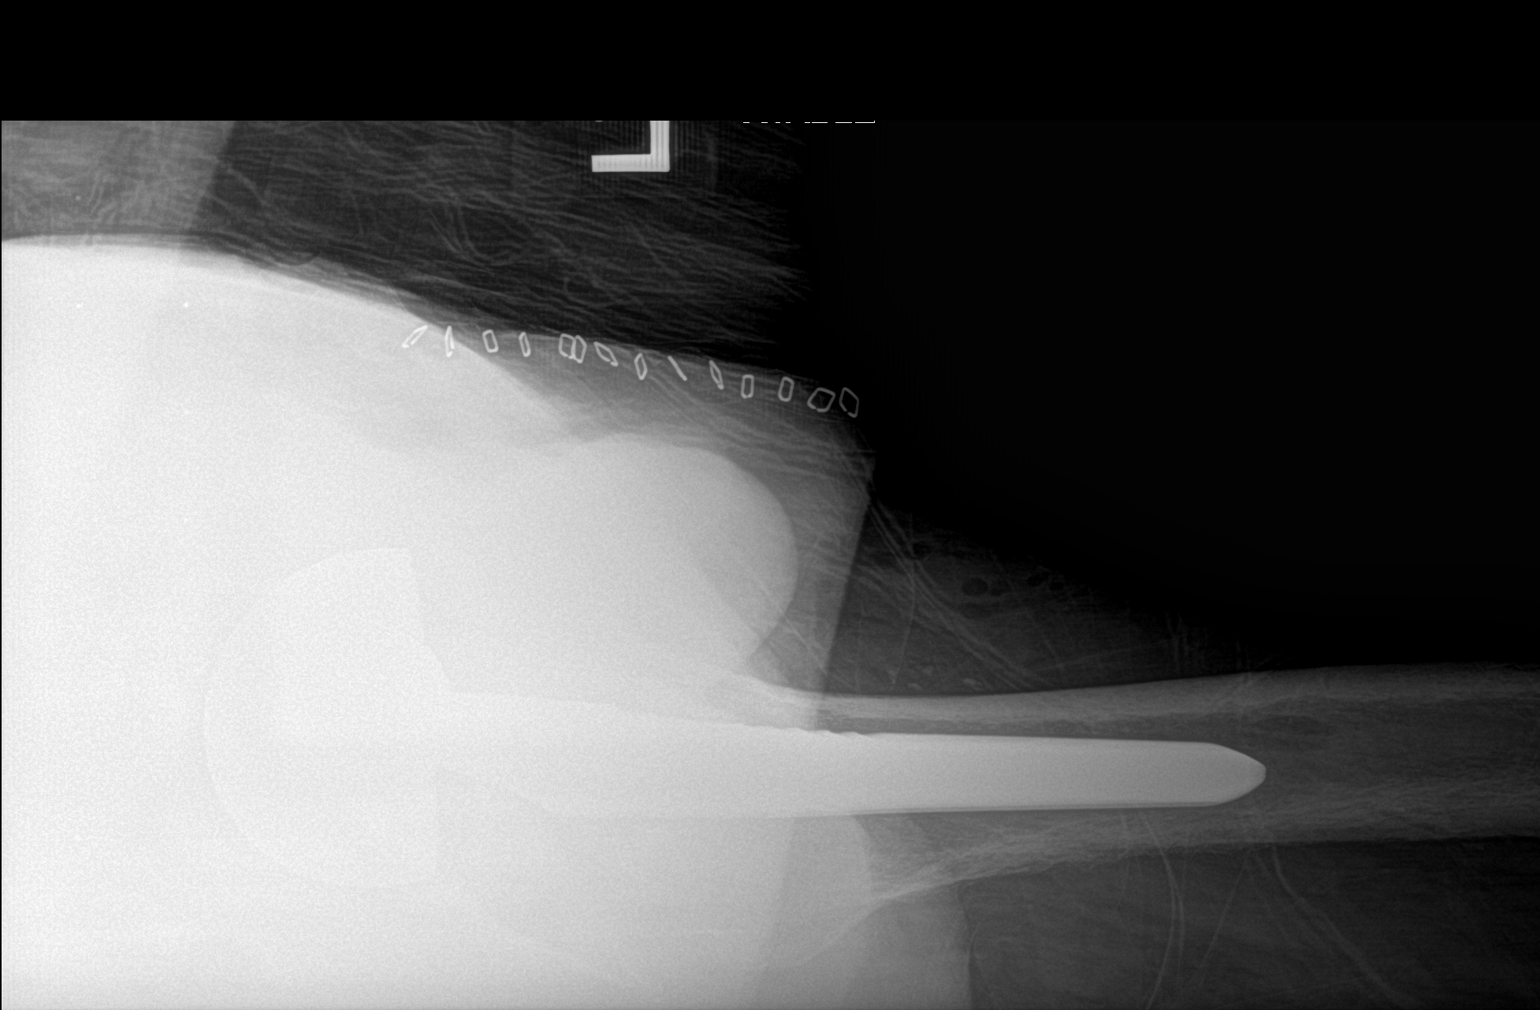

[2 of 2 positions shown; findings below may reference images not displayed]

FINDINGS: Postoperative changes from interval left total hip arthroplasty.
Prosthetic and femoral components appear well seated. No
periprosthetic fracture or other complication. Associated
postoperative soft tissue swelling and emphysema overlies the hip.
Skin staples in place.
IMPRESSION: Postoperative changes from interval left total hip arthroplasty
without complication.

## 2018-07-16 DIAGNOSIS — R9431 Abnormal electrocardiogram [ECG] [EKG]: Secondary | ICD-10-CM | POA: Diagnosis not present

## 2018-07-16 DIAGNOSIS — I482 Chronic atrial fibrillation, unspecified: Secondary | ICD-10-CM | POA: Diagnosis not present

## 2018-07-17 DIAGNOSIS — H903 Sensorineural hearing loss, bilateral: Secondary | ICD-10-CM | POA: Diagnosis not present

## 2018-07-17 DIAGNOSIS — J300 Vasomotor rhinitis: Secondary | ICD-10-CM | POA: Diagnosis not present

## 2018-07-17 DIAGNOSIS — H6123 Impacted cerumen, bilateral: Secondary | ICD-10-CM | POA: Diagnosis not present

## 2018-08-20 DIAGNOSIS — J431 Panlobular emphysema: Secondary | ICD-10-CM | POA: Diagnosis not present

## 2018-08-20 DIAGNOSIS — I25810 Atherosclerosis of coronary artery bypass graft(s) without angina pectoris: Secondary | ICD-10-CM | POA: Diagnosis not present

## 2018-08-20 DIAGNOSIS — E440 Moderate protein-calorie malnutrition: Secondary | ICD-10-CM | POA: Diagnosis not present

## 2018-10-17 ENCOUNTER — Emergency Department
Admission: EM | Admit: 2018-10-17 | Discharge: 2018-10-28 | Disposition: E | Payer: PPO | Attending: Student | Admitting: Student

## 2018-10-17 DIAGNOSIS — J449 Chronic obstructive pulmonary disease, unspecified: Secondary | ICD-10-CM | POA: Diagnosis not present

## 2018-10-17 DIAGNOSIS — F17220 Nicotine dependence, chewing tobacco, uncomplicated: Secondary | ICD-10-CM | POA: Insufficient documentation

## 2018-10-17 DIAGNOSIS — Z85828 Personal history of other malignant neoplasm of skin: Secondary | ICD-10-CM | POA: Diagnosis not present

## 2018-10-17 DIAGNOSIS — Z951 Presence of aortocoronary bypass graft: Secondary | ICD-10-CM | POA: Diagnosis not present

## 2018-10-17 DIAGNOSIS — I469 Cardiac arrest, cause unspecified: Secondary | ICD-10-CM | POA: Insufficient documentation

## 2018-10-17 DIAGNOSIS — I10 Essential (primary) hypertension: Secondary | ICD-10-CM | POA: Insufficient documentation

## 2018-10-17 DIAGNOSIS — Z96642 Presence of left artificial hip joint: Secondary | ICD-10-CM | POA: Insufficient documentation

## 2018-10-17 DIAGNOSIS — Z79899 Other long term (current) drug therapy: Secondary | ICD-10-CM | POA: Insufficient documentation

## 2018-10-17 DIAGNOSIS — R000 Tachycardia, unspecified: Secondary | ICD-10-CM | POA: Diagnosis not present

## 2018-10-17 DIAGNOSIS — Z7901 Long term (current) use of anticoagulants: Secondary | ICD-10-CM | POA: Insufficient documentation

## 2018-10-17 DIAGNOSIS — I2510 Atherosclerotic heart disease of native coronary artery without angina pectoris: Secondary | ICD-10-CM | POA: Insufficient documentation

## 2018-10-17 DIAGNOSIS — I4891 Unspecified atrial fibrillation: Secondary | ICD-10-CM | POA: Insufficient documentation

## 2018-10-17 LAB — GLUCOSE, CAPILLARY: Glucose-Capillary: 106 mg/dL — ABNORMAL HIGH (ref 70–99)

## 2018-10-17 MED ORDER — EPINEPHRINE 1 MG/10ML IJ SOSY
PREFILLED_SYRINGE | INTRAMUSCULAR | Status: DC | PRN
  Administered 2018-10-17 (×3): 1 mg via INTRAVENOUS

## 2018-10-17 MED ORDER — CALCIUM CHLORIDE 10 % IV SOLN
INTRAVENOUS | Status: DC | PRN
  Administered 2018-10-17 (×3): 1 g via INTRAVENOUS

## 2018-10-17 MED ORDER — SODIUM BICARBONATE 8.4 % IV SOLN
INTRAVENOUS | Status: DC | PRN
  Administered 2018-10-17 (×3): 50 meq via INTRAVENOUS

## 2018-10-17 MED FILL — Medication: Qty: 1 | Status: AC

## 2018-10-28 NOTE — Code Documentation (Signed)
Pt arrives from home to ED via POV. Per pt's caregiver, pt was agonal breathing at home, caregiver brought him to ED. Pt unresponsive in POV upon arrival.

## 2018-10-28 NOTE — Code Documentation (Signed)
Pt's daughter notified.

## 2018-10-28 NOTE — ED Provider Notes (Signed)
[facility] Emergency Department Provider Note  ____________________________________________   First MD Initiated Contact with Patient 11-15-18 1448     (approximate)  I have reviewed the triage vital signs and the nursing notes.  History  Chief Complaint No chief complaint on file.    HPI Billy Choi is a 85 y.o. male with hx as below, who presents unresponsive and pulseless, in cardiac arrest. Patient brought to the ED by his caregiver, and was found to be pulseless. Compression started immediately on arrival to the ED.   Hx limited due to clinical condition -  patient in cardiac arrest.          Past Medical Hx Past Medical History:  Diagnosis Date  . Atrial fibrillation ([place])    a. Seen on ecg 07/2015 - pt unaware of history and is not on [place]. Per outpt clinic note, pt refused eval. CHA2DS2VASc = 4.  . Basal cell carcinoma   . CAD (coronary artery disease)    a. 1999 s/p CABG @ [facility] - presumably x 3 (2 VG markers on CXR, presume LIMA used).  . COPD (chronic obstructive pulmonary disease) ([place])   . Hyperlipidemia   . Hypertension     Problem List Patient Active Problem List   Diagnosis Date Noted  . COPD with acute exacerbation ([place]) 11/19/2016  . Hypertension 02/09/2016  . Hypercholesterolemia 02/09/2016  . CAD (coronary artery disease) 08/01/2015  . COPD (chronic obstructive pulmonary disease) ([place]) 08/01/2015  . BPH (benign prostatic hyperplasia) 08/01/2015  . Irregular heart beat 08/01/2015  . DNR (do not resuscitate) 08/01/2015  . Atrial fibrillation ([place]) 08/01/2015    Past Surgical Hx Past Surgical History:  Procedure Laterality Date  . CORONARY ARTERY BYPASS GRAFT  1999  . [place]  . HERNIA REPAIR     x2  . HIP ARTHROPLASTY Left 11/20/2016   Procedure: ARTHROPLASTY BIPOLAR HIP (HEMIARTHROPLASTY);  Surgeon: Hessie Knows, MD;  Location: [facility] ORS;  Service: Orthopedics;  Laterality: Left;  . SKIN CANCER  EXCISION  2016    Medications Prior to Admission medications   Medication Sig Start Date End Date Taking? Authorizing Provider  acetaminophen (TYLENOL) 325 MG tablet Take 2 tablets (650 mg total) by mouth every 6 (six) hours as needed for mild pain (or Fever >/= 101). 11/22/16   Gouru, Illene Silver, MD  albuterol (PROVENTIL) (2.5 MG/3ML) 0.083% nebulizer solution Take 3 mLs (2.5 mg total) by nebulization every 4 (four) hours as needed for wheezing or shortness of breath. 11/22/16   Nicholes Mango, MD  alum & mag hydroxide-simeth (MAALOX/MYLANTA) 200-200-20 MG/5ML suspension Take 30 mLs by mouth every 4 (four) hours as needed for indigestion. 11/22/16   Nicholes Mango, MD  cholecalciferol (VITAMIN D) 1000 units tablet Take 1,000 Units by mouth daily.    [provider]  docusate sodium (COLACE) 100 MG capsule Take 1 capsule (100 mg total) by mouth daily. 11/23/16   Gouru, Illene Silver, MD  enoxaparin (LOVENOX) 40 MG/0.4ML injection Inject 0.4 mLs (40 mg total) into the skin daily. 11/22/16 12/06/16  Duanne Guess, PA-C  feeding supplement, ENSURE ENLIVE, (ENSURE ENLIVE) LIQD Take 237 mLs by mouth 2 (two) times daily between meals. 11/22/16   Gouru, Illene Silver, MD  lisinopril (PRINIVIL,ZESTRIL) 40 MG tablet Take 40 mg by mouth daily.    [provider]  Multiple Vitamins-Minerals (EQ COMPLETE MULTIVIT ADULT 50+) TABS Take 1 tablet by mouth daily.    [provider]  pantoprazole (PROTONIX) 40 MG  tablet Take 1 tablet (40 mg total) by mouth daily. 11/23/16   Gouru, Illene Silver, MD  polyethylene glycol (MIRALAX / GLYCOLAX) packet Take 17 g by mouth daily as needed for mild constipation. 11/22/16   Gouru, Illene Silver, MD  predniSONE (STERAPRED UNI-PAK 21 TAB) 10 MG (21) TBPK tablet Take by mouth daily. Take 6 tablets by mouth for 1 day followed by  5 tablets by mouth for 1 day followed by  4 tablets by mouth for 1 day followed by  3 tablets by mouth for 1 day followed by  2 tablets by mouth for 1 day followed by   1 tablet by mouth for a day and stop 11/22/16   Nicholes Mango, MD  simvastatin (ZOCOR) 80 MG tablet Take 40 mg by mouth at bedtime.     [provider]  traMADol (ULTRAM) 50 MG tablet Take 1 tablet (50 mg total) by mouth every 6 (six) hours as needed for moderate pain. 11/22/16   Nicholes Mango, MD  vitamin B-12 (CYANOCOBALAMIN) 1000 MCG tablet Take 1,000 mcg by mouth daily.    [provider]    Allergies Lipitor [atorvastatin]  Family Hx Family History  Problem Relation Age of Onset  . Arthritis Father     Social Hx Social History   Tobacco Use  . Smoking status: Former Smoker    Packs/day: 1.00    Years: 40.00    Pack years: 40.00  . Smokeless tobacco: Current User    Types: Chew  . Tobacco comment: quite 50 yrs ago  Substance Use Topics  . Alcohol use: Yes    Comment: wine on occasion  . Drug use: No     Review of Systems Unable to obtain due to patient's clinical status.   Physical Exam  Vital Signs: ED Triage Vitals [11/05/18 1506]  Enc Vitals Group     BP      Pulse      Resp      Temp      Temp src      SpO2      Weight 139 lb 15.9 oz (63.5 kg)     Height 5\' 6"  (1.676 m)     Head Circumference      [place] Flow      Pain Score      Pain Loc      Pain Edu?      Excl. in [place]?      Constitutional: Unresponsive.  Eyes: Pupils fixed and dilated.  Head: Normocephalic. Atraumatic. Nose: No epistaxis. No rhinorrhea. Mouth/Throat: Mucous membranes are dry.  Neck: No stridor.   Cardiovascular: Pulseless. Respiratory: Apenic. BVM started on arrival. Gastrointestinal: No distention.  Musculoskeletal: No lower extremity edema. Neurologic:  Unresponsive. Skin: Scattered old ecchymosis.  Psychiatric: Unable to assess.   EKG  Personally reviewed. EKG obtained during compression pause for pulse check. Wide complex, regular tachycardia.    Radiology  N/A   Procedures  Procedure(s) performed (including critical care):  .Critical  Care Performed by: Lilia Pro., MD Authorized by: Lilia Pro., MD   Critical care provider statement:    Critical care time (minutes):  45   Critical care was necessary to treat or prevent imminent or life-threatening deterioration of the following conditions:  Circulatory failure and cardiac failure   Critical care was time spent personally by me on the following activities:  Evaluation of patient's response to treatment, examination of patient, ordering and performing treatments and interventions, ordering and review of laboratory  studies, ordering and review of radiographic studies, pulse oximetry, re-evaluation of patient's condition, obtaining history from patient or surrogate and review of old charts Procedure Name: Intubation Date/Time: 10-19-2018 3:36 PM Performed by: Lilia Pro., MD Pre-anesthesia Checklist: Patient identified, Patient being monitored, Emergency Drugs available and Suction available Oxygen Delivery Method: Ambu bag Preoxygenation: Pre-oxygenation with 100% oxygen Induction Type: Rapid sequence Ventilation: Mask ventilation without difficulty Laryngoscope Size: Glidescope Tube size: 7.5 mm Number of attempts: 1 Airway Equipment and Method: Video-laryngoscopy and Stylet Placement Confirmation: ETT inserted through vocal cords under direct vision and CO2 detector Tube secured with: Tape Comments: Patient intubated under emergent condition - cardiac arrest - without complication. No RSI medications given.         Initial Impression / Assessment and Plan / ED Course  85 y.o. male with hx as below, who presents unresponsive and pulseless, in PEA cardiac arrest. Patient brought to the ED by his caregiver, was found to be pulseless in the car. Compression started immediately on arrival to the ED. Intubated without issue or complication. Patient given multiple doses of epinephrine, calcium, bicarb and remained largely in PEA, aside from 1 pulse check that  appeared consistent with V. fib, shocked with no improvement.  The patient's daughter arrived shortly after code was initiated, who serves as his medical power of attorney, and confirmed he would not want any further measures or resuscitation attempted.  Code stopped, time of death 56. Family aware and updated, all questions answered.  Discussed patient with his PCP, Dr. Sabra Heck, who will complete the death certificate.    Final Clinical Impression(s) / ED Diagnosis  Final diagnoses:  Cardiac arrest ([place])  PEA (Pulseless electrical activity) ([place])     Note:  This document was prepared using Dragon voice recognition software and may include unintentional dictation errors.   Lilia Pro., MD 10-19-18 [phone]

## 2018-10-28 NOTE — ED Notes (Signed)
Pt belongings: blue pants on, 1 pair of brow shoes; top and bottom dentures; wallet contains $40. Pt belongings place in belonging bag and sent with orderly.

## 2018-10-28 NOTE — ED Notes (Signed)
Pt's daughter at bedside at this time.

## 2018-10-28 DEATH — deceased
